# Patient Record
Sex: Female | Born: 1935 | Race: White | Hispanic: No | Smoking: Never smoker
Health system: Southern US, Community
[De-identification: ages and names within clinical notes are randomized; demographics above are authoritative.]

## PROBLEM LIST (undated history)

## (undated) DIAGNOSIS — I10 Essential (primary) hypertension: Secondary | ICD-10-CM

## (undated) DIAGNOSIS — E876 Hypokalemia: Secondary | ICD-10-CM

## (undated) DIAGNOSIS — M810 Age-related osteoporosis without current pathological fracture: Secondary | ICD-10-CM

## (undated) DIAGNOSIS — I639 Cerebral infarction, unspecified: Secondary | ICD-10-CM

## (undated) HISTORY — DX: Age-related osteoporosis without current pathological fracture: M81.0

## (undated) HISTORY — PX: HIP SURGERY: SHX245

## (undated) HISTORY — PX: BLADDER SUSPENSION: SHX72

---

## 2012-03-21 ENCOUNTER — Inpatient Hospital Stay: Payer: Self-pay | Admitting: General Practice

## 2012-03-21 LAB — URINALYSIS, COMPLETE
Bilirubin,UR: NEGATIVE
Blood: NEGATIVE
Glucose,UR: NEGATIVE mg/dL (ref 0–75)
Leukocyte Esterase: NEGATIVE
Nitrite: NEGATIVE
Protein: NEGATIVE
Squamous Epithelial: <1
WBC UR: NONE SEEN /HPF (ref 0–5)

## 2012-03-21 LAB — CBC
HGB: 12.8 g/dL (ref 12.0–16.0)
MCH: 30.1 pg (ref 26.0–34.0)
Platelet: 314 10*3/uL (ref 150–440)
RBC: 4.24 10*6/uL (ref 3.80–5.20)

## 2012-03-21 LAB — BASIC METABOLIC PANEL
Anion Gap: 9 (ref 7–16)
Chloride: 106 mmol/L (ref 98–107)
Co2: 25 mmol/L (ref 21–32)
Creatinine: 0.8 mg/dL (ref 0.60–1.30)
EGFR (African American): >60
EGFR (Non-African Amer.): >60
Glucose: 91 mg/dL (ref 65–99)
Osmolality: 282 (ref 275–301)
Potassium: 3.9 mmol/L (ref 3.5–5.1)
Sodium: 140 mmol/L (ref 136–145)

## 2012-03-22 LAB — PLATELET COUNT: Platelet: 231 10*3/uL (ref 150–440)

## 2012-03-22 LAB — BASIC METABOLIC PANEL
BUN: 14 mg/dL (ref 7–18)
Calcium, Total: 7.9 mg/dL — ABNORMAL LOW (ref 8.5–10.1)
Chloride: 110 mmol/L — ABNORMAL HIGH (ref 98–107)
Creatinine: 0.77 mg/dL (ref 0.60–1.30)
EGFR (African American): >60
EGFR (Non-African Amer.): >60
Glucose: 108 mg/dL — ABNORMAL HIGH (ref 65–99)
Osmolality: 284 (ref 275–301)

## 2012-03-23 LAB — BASIC METABOLIC PANEL
Anion Gap: 7 (ref 7–16)
Calcium, Total: 7.7 mg/dL — ABNORMAL LOW (ref 8.5–10.1)
Chloride: 110 mmol/L — ABNORMAL HIGH (ref 98–107)
Creatinine: 0.69 mg/dL (ref 0.60–1.30)
EGFR (African American): >60
Glucose: 107 mg/dL — ABNORMAL HIGH (ref 65–99)
Osmolality: 282 (ref 275–301)
Potassium: 3.9 mmol/L (ref 3.5–5.1)
Sodium: 141 mmol/L (ref 136–145)

## 2012-03-24 LAB — HEMOGLOBIN: HGB: 8.3 g/dL — ABNORMAL LOW (ref 12.0–16.0)

## 2012-03-25 LAB — CBC WITH DIFFERENTIAL/PLATELET
Basophil %: 0.2 %
HGB: 8.5 g/dL — ABNORMAL LOW (ref 12.0–16.0)
Lymphocyte #: 1.5 10*3/uL (ref 1.0–3.6)
Lymphocyte %: 13.7 %
MCHC: 33.6 g/dL (ref 32.0–36.0)
MCV: 92 fL (ref 80–100)
Monocyte #: 1.4 x10 3/mm — ABNORMAL HIGH (ref 0.2–0.9)
Monocyte %: 12.5 %
Platelet: 307 10*3/uL (ref 150–440)
RBC: 2.75 10*6/uL — ABNORMAL LOW (ref 3.80–5.20)
RDW: 11.9 % (ref 11.5–14.5)
WBC: 11 10*3/uL (ref 3.6–11.0)

## 2012-03-25 LAB — BASIC METABOLIC PANEL
Anion Gap: 6 — ABNORMAL LOW (ref 7–16)
Calcium, Total: 7.9 mg/dL — ABNORMAL LOW (ref 8.5–10.1)
Co2: 26 mmol/L (ref 21–32)
Creatinine: 0.6 mg/dL (ref 0.60–1.30)
EGFR (African American): >60
EGFR (Non-African Amer.): >60
Osmolality: 272 (ref 275–301)
Potassium: 3.7 mmol/L (ref 3.5–5.1)
Sodium: 136 mmol/L (ref 136–145)

## 2012-03-26 LAB — HEMOGLOBIN: HGB: 7.6 g/dL — ABNORMAL LOW (ref 12.0–16.0)

## 2012-03-27 ENCOUNTER — Encounter: Payer: Self-pay | Admitting: Internal Medicine

## 2012-03-27 LAB — HEMOGLOBIN: HGB: 9.3 g/dL — ABNORMAL LOW (ref 12.0–16.0)

## 2012-03-30 ENCOUNTER — Encounter: Payer: Self-pay | Admitting: Internal Medicine

## 2012-04-02 LAB — URINALYSIS, COMPLETE
Bilirubin,UR: NEGATIVE
Blood: NEGATIVE
Ph: 6 (ref 4.5–8.0)
Specific Gravity: 1.014 (ref 1.003–1.030)
Squamous Epithelial: 1

## 2012-04-30 ENCOUNTER — Encounter: Payer: Self-pay | Admitting: Internal Medicine

## 2012-11-12 ENCOUNTER — Encounter: Payer: Self-pay | Admitting: *Deleted

## 2012-11-13 ENCOUNTER — Encounter: Payer: Self-pay | Admitting: Podiatry

## 2012-11-13 ENCOUNTER — Ambulatory Visit (INDEPENDENT_AMBULATORY_CARE_PROVIDER_SITE_OTHER): Payer: Medicare Other | Admitting: Podiatry

## 2012-11-13 VITALS — BP 119/57 | HR 97 | Resp 12 | Ht 60.0 in

## 2012-11-13 DIAGNOSIS — B351 Tinea unguium: Secondary | ICD-10-CM

## 2012-11-13 DIAGNOSIS — M79609 Pain in unspecified limb: Secondary | ICD-10-CM | POA: Insufficient documentation

## 2012-11-13 NOTE — Progress Notes (Signed)
Cheryl Moreno presents today for chief complaint of painful toenails one through 5 bilateral.  Objective: Pulses are palpable bilateral no calf pain and bilateral. Nails are thick yellow dystrophic clinically mycotic 1 through 5 bilaterally. With pain on palpation and range of motion.  Assessment: Pain in limb secondary to onychomycosis 1 through 5 bilateral.  Plan: Debridement nails 1 through 5 bilaterally covered service secondary to pain.

## 2013-02-02 ENCOUNTER — Emergency Department: Payer: Self-pay | Admitting: Emergency Medicine

## 2013-02-02 LAB — CBC
HCT: 43.3 % (ref 35.0–47.0)
HGB: 14.6 g/dL (ref 12.0–16.0)
MCH: 30.2 pg (ref 26.0–34.0)
MCHC: 33.7 g/dL (ref 32.0–36.0)
MCV: 90 fL (ref 80–100)
PLATELETS: 271 10*3/uL (ref 150–440)
RBC: 4.82 10*6/uL (ref 3.80–5.20)
RDW: 13.3 % (ref 11.5–14.5)
WBC: 7.1 10*3/uL (ref 3.6–11.0)

## 2013-02-02 LAB — RAPID INFLUENZA A&B ANTIGENS

## 2013-02-02 LAB — COMPREHENSIVE METABOLIC PANEL
ALK PHOS: 110 U/L
ALT: 28 U/L (ref 12–78)
Albumin: 3.3 g/dL — ABNORMAL LOW (ref 3.4–5.0)
Anion Gap: 6 — ABNORMAL LOW (ref 7–16)
BILIRUBIN TOTAL: 0.3 mg/dL (ref 0.2–1.0)
BUN: 11 mg/dL (ref 7–18)
CALCIUM: 8.8 mg/dL (ref 8.5–10.1)
CO2: 30 mmol/L (ref 21–32)
CREATININE: 0.68 mg/dL (ref 0.60–1.30)
Chloride: 99 mmol/L (ref 98–107)
EGFR (African American): >60
Glucose: 133 mg/dL — ABNORMAL HIGH (ref 65–99)
OSMOLALITY: 271 (ref 275–301)
Potassium: 4.3 mmol/L (ref 3.5–5.1)
SGOT(AST): 38 U/L — ABNORMAL HIGH (ref 15–37)
Sodium: 135 mmol/L — ABNORMAL LOW (ref 136–145)
Total Protein: 7.5 g/dL (ref 6.4–8.2)

## 2013-02-02 LAB — LIPASE, BLOOD: Lipase: 143 U/L (ref 73–393)

## 2013-02-12 ENCOUNTER — Ambulatory Visit (INDEPENDENT_AMBULATORY_CARE_PROVIDER_SITE_OTHER): Payer: Medicare Other | Admitting: Podiatry

## 2013-02-12 ENCOUNTER — Encounter: Payer: Self-pay | Admitting: Podiatry

## 2013-02-12 VITALS — BP 84/52 | HR 53 | Resp 18

## 2013-02-12 DIAGNOSIS — B351 Tinea unguium: Secondary | ICD-10-CM

## 2013-02-12 DIAGNOSIS — M79609 Pain in unspecified limb: Secondary | ICD-10-CM

## 2013-02-12 NOTE — Progress Notes (Signed)
He presents today with an assistant for routine nail debridement. She states that her toenails are painful.  Objective: Vital signs are stable she is alert and oriented x3. Pulses are palpable bilateral. Nails are thick yellow dystrophic clinically mycotic and painful palpation as well as debridement. She's wheelchair-bound.  Assessment: Pain in limb secondary to onychomycosis 1 through 5 bilateral.  Plan: Debridement of nails 1 through 5 bilateral is cover service secondary to pain followup with her in 3 months.

## 2013-02-27 ENCOUNTER — Emergency Department: Payer: Self-pay | Admitting: Emergency Medicine

## 2013-02-27 LAB — URINALYSIS, COMPLETE
Bacteria: NONE SEEN
Bilirubin,UR: NEGATIVE
Cellular Cast: 6
GLUCOSE, UR: NEGATIVE mg/dL (ref 0–75)
Ketone: NEGATIVE
NITRITE: NEGATIVE
PH: 5 (ref 4.5–8.0)
RBC,UR: 2 /HPF (ref 0–5)
Specific Gravity: 1.014 (ref 1.003–1.030)
Transitional Epi: 4

## 2013-02-27 LAB — COMPREHENSIVE METABOLIC PANEL
ANION GAP: 6 — AB (ref 7–16)
Albumin: 3.1 g/dL — ABNORMAL LOW (ref 3.4–5.0)
Alkaline Phosphatase: 95 U/L
BILIRUBIN TOTAL: 0.3 mg/dL (ref 0.2–1.0)
BUN: 30 mg/dL — AB (ref 7–18)
CALCIUM: 8.7 mg/dL (ref 8.5–10.1)
CHLORIDE: 108 mmol/L — AB (ref 98–107)
CREATININE: 1.17 mg/dL (ref 0.60–1.30)
Co2: 26 mmol/L (ref 21–32)
EGFR (African American): 48 — ABNORMAL LOW
GFR CALC NON AF AMER: 42 — AB
GLUCOSE: 105 mg/dL — AB (ref 65–99)
Osmolality: 286 (ref 275–301)
Potassium: 3.8 mmol/L (ref 3.5–5.1)
SGOT(AST): 21 U/L (ref 15–37)
SGPT (ALT): 27 U/L (ref 12–78)
SODIUM: 140 mmol/L (ref 136–145)
Total Protein: 7.6 g/dL (ref 6.4–8.2)

## 2013-02-27 LAB — CBC
HCT: 41.3 % (ref 35.0–47.0)
HGB: 13.6 g/dL (ref 12.0–16.0)
MCH: 30 pg (ref 26.0–34.0)
MCHC: 33 g/dL (ref 32.0–36.0)
MCV: 91 fL (ref 80–100)
Platelet: 366 10*3/uL (ref 150–440)
RBC: 4.55 10*6/uL (ref 3.80–5.20)
RDW: 13.7 % (ref 11.5–14.5)
WBC: 9.1 10*3/uL (ref 3.6–11.0)

## 2013-02-27 LAB — SYNOVIAL CELL COUNT + DIFF, W/ CRYSTALS
Basophil: 0 %
Crystals, Joint Fluid: NONE SEEN
Eosinophil: 2 %
LYMPHS PCT: 77 %
Neutrophils: 18 %
Nucleated Cell Count: 15188 /mm3
OTHER CELLS BF: 0 %
OTHER MONONUCLEAR CELLS: 3 %

## 2013-02-27 LAB — LIPASE, BLOOD: Lipase: 110 U/L (ref 73–393)

## 2013-02-27 LAB — TROPONIN I: Troponin-I: <0.02 ng/mL

## 2013-05-14 ENCOUNTER — Encounter: Payer: Self-pay | Admitting: Podiatry

## 2013-05-14 ENCOUNTER — Ambulatory Visit (INDEPENDENT_AMBULATORY_CARE_PROVIDER_SITE_OTHER): Payer: Medicare Other | Admitting: Podiatry

## 2013-05-14 VITALS — BP 112/68 | HR 60 | Resp 14 | Wt 125.0 lb

## 2013-05-14 DIAGNOSIS — B351 Tinea unguium: Secondary | ICD-10-CM

## 2013-05-14 DIAGNOSIS — M79609 Pain in unspecified limb: Secondary | ICD-10-CM

## 2013-05-14 NOTE — Progress Notes (Signed)
She presents today with a chief complaint of the foot elongated toenails one through 5 bilateral.  Objective: Pulses are palpable bilateral. Nails are thick yellow dystrophic with mycotic painful palpation.  Assessment: Pain in limb secondary to onychomycosis 1 through 5 bilateral.  Plan: Debridement of nails 1 through 5 bilateral covered service secondary to pain.

## 2013-06-11 ENCOUNTER — Ambulatory Visit: Payer: Self-pay | Admitting: Neurology

## 2013-06-11 ENCOUNTER — Inpatient Hospital Stay: Payer: Self-pay | Admitting: Internal Medicine

## 2013-06-11 LAB — CBC WITH DIFFERENTIAL/PLATELET
BASOS ABS: 0.1 10*3/uL (ref 0.0–0.1)
BASOS PCT: 0.2 %
Eosinophil #: 0 10*3/uL (ref 0.0–0.7)
Eosinophil %: 0 %
HCT: 45.8 % (ref 35.0–47.0)
HGB: 15.1 g/dL (ref 12.0–16.0)
Lymphocyte #: 1.5 10*3/uL (ref 1.0–3.6)
Lymphocyte %: 6.9 %
MCH: 29.7 pg (ref 26.0–34.0)
MCHC: 32.9 g/dL (ref 32.0–36.0)
MCV: 91 fL (ref 80–100)
MONOS PCT: 6 %
Monocyte #: 1.3 x10 3/mm — ABNORMAL HIGH (ref 0.2–0.9)
NEUTROS PCT: 86.9 %
Neutrophil #: 18.7 10*3/uL — ABNORMAL HIGH (ref 1.4–6.5)
Platelet: 312 10*3/uL (ref 150–440)
RBC: 5.06 10*6/uL (ref 3.80–5.20)
RDW: 13.5 % (ref 11.5–14.5)
WBC: 21.5 10*3/uL — AB (ref 3.6–11.0)

## 2013-06-11 LAB — COMPREHENSIVE METABOLIC PANEL
ANION GAP: 9 (ref 7–16)
AST: 22 U/L (ref 15–37)
Albumin: 3.6 g/dL (ref 3.4–5.0)
Alkaline Phosphatase: 104 U/L
BILIRUBIN TOTAL: 0.5 mg/dL (ref 0.2–1.0)
BUN: 13 mg/dL (ref 7–18)
CO2: 25 mmol/L (ref 21–32)
Calcium, Total: 9.1 mg/dL (ref 8.5–10.1)
Chloride: 102 mmol/L (ref 98–107)
Creatinine: 0.7 mg/dL (ref 0.60–1.30)
EGFR (African American): >60
EGFR (Non-African Amer.): >60
Glucose: 130 mg/dL — ABNORMAL HIGH (ref 65–99)
Osmolality: 274 (ref 275–301)
Potassium: 3.9 mmol/L (ref 3.5–5.1)
SGPT (ALT): 21 U/L (ref 12–78)
Sodium: 136 mmol/L (ref 136–145)
Total Protein: 8.3 g/dL — ABNORMAL HIGH (ref 6.4–8.2)

## 2013-06-11 LAB — URINALYSIS, COMPLETE
Bacteria: NONE SEEN
Bilirubin,UR: NEGATIVE
Blood: NEGATIVE
GLUCOSE, UR: NEGATIVE mg/dL (ref 0–75)
Leukocyte Esterase: NEGATIVE
Nitrite: NEGATIVE
PH: 7 (ref 4.5–8.0)
Specific Gravity: 1.013 (ref 1.003–1.030)
Squamous Epithelial: NONE SEEN

## 2013-06-11 LAB — TROPONIN I: Troponin-I: <0.02 ng/mL

## 2013-06-11 LAB — PROTIME-INR
INR: 1
PROTHROMBIN TIME: 13.4 s (ref 11.5–14.7)

## 2013-06-11 LAB — APTT: Activated PTT: 30.9 secs (ref 23.6–35.9)

## 2013-06-12 LAB — BASIC METABOLIC PANEL
Anion Gap: 7 (ref 7–16)
BUN: 18 mg/dL (ref 7–18)
CHLORIDE: 102 mmol/L (ref 98–107)
Calcium, Total: 8.8 mg/dL (ref 8.5–10.1)
Co2: 27 mmol/L (ref 21–32)
Creatinine: 0.95 mg/dL (ref 0.60–1.30)
GFR CALC NON AF AMER: 53 — AB
Glucose: 123 mg/dL — ABNORMAL HIGH (ref 65–99)
Osmolality: 275 (ref 275–301)
Potassium: 4.1 mmol/L (ref 3.5–5.1)
Sodium: 136 mmol/L (ref 136–145)

## 2013-06-12 LAB — CBC WITH DIFFERENTIAL/PLATELET
BASOS ABS: 0 10*3/uL (ref 0.0–0.1)
Basophil %: 0.2 %
Eosinophil #: 0 10*3/uL (ref 0.0–0.7)
Eosinophil %: 0.1 %
HCT: 42 % (ref 35.0–47.0)
HGB: 13.6 g/dL (ref 12.0–16.0)
LYMPHS PCT: 13.9 %
Lymphocyte #: 2.6 10*3/uL (ref 1.0–3.6)
MCH: 29.2 pg (ref 26.0–34.0)
MCHC: 32.3 g/dL (ref 32.0–36.0)
MCV: 90 fL (ref 80–100)
Monocyte #: 1 x10 3/mm — ABNORMAL HIGH (ref 0.2–0.9)
Monocyte %: 5.1 %
NEUTROS PCT: 80.7 %
Neutrophil #: 15.3 10*3/uL — ABNORMAL HIGH (ref 1.4–6.5)
Platelet: 306 10*3/uL (ref 150–440)
RBC: 4.65 10*6/uL (ref 3.80–5.20)
RDW: 13.4 % (ref 11.5–14.5)
WBC: 19 10*3/uL — ABNORMAL HIGH (ref 3.6–11.0)

## 2013-06-12 LAB — LIPID PANEL
Cholesterol: 120 mg/dL (ref 0–200)
HDL Cholesterol: 64 mg/dL — ABNORMAL HIGH (ref 40–60)
Ldl Cholesterol, Calc: 43 mg/dL (ref 0–100)
TRIGLYCERIDES: 66 mg/dL (ref 0–200)
VLDL Cholesterol, Calc: 13 mg/dL (ref 5–40)

## 2013-08-13 ENCOUNTER — Ambulatory Visit: Payer: Medicare Other | Admitting: Podiatry

## 2013-08-29 ENCOUNTER — Ambulatory Visit (INDEPENDENT_AMBULATORY_CARE_PROVIDER_SITE_OTHER): Payer: Medicare Other | Admitting: Podiatry

## 2013-08-29 DIAGNOSIS — B351 Tinea unguium: Secondary | ICD-10-CM

## 2013-08-29 DIAGNOSIS — M79676 Pain in unspecified toe(s): Secondary | ICD-10-CM

## 2013-08-29 DIAGNOSIS — M79609 Pain in unspecified limb: Secondary | ICD-10-CM

## 2013-08-30 NOTE — Progress Notes (Signed)
She presents today with a chief complaint of painful elongated toenails. ° °Objective: Nails are thick yellow dystrophic onychomycotic and painful palpation. Pulses are palpable bilateral. ° °Assessment: Pain in limb secondary to onychomycosis 1 through 5 bilateral. ° °Plan: Debridement of nails 1 through 5 bilateral covered service secondary to pain. °

## 2013-12-03 ENCOUNTER — Ambulatory Visit: Payer: Medicare Other | Admitting: Podiatry

## 2013-12-10 ENCOUNTER — Ambulatory Visit (INDEPENDENT_AMBULATORY_CARE_PROVIDER_SITE_OTHER): Payer: Medicare Other | Admitting: Podiatry

## 2013-12-10 DIAGNOSIS — M79676 Pain in unspecified toe(s): Secondary | ICD-10-CM

## 2013-12-10 DIAGNOSIS — B351 Tinea unguium: Secondary | ICD-10-CM

## 2013-12-10 NOTE — Progress Notes (Signed)
She presents today with a chief complaint of painful elongated toenails.  Objective: Nails are thick yellow dystrophic onychomycotic and painful palpation. Pulses are palpable bilateral.  Assessment: Pain in limb secondary to onychomycosis 1 through 5 bilateral.  Plan: Debridement of nails 1 through 5 bilateral covered service secondary to pain.

## 2014-03-11 ENCOUNTER — Ambulatory Visit: Payer: Medicare Other | Admitting: Podiatry

## 2014-05-22 NOTE — Consult Note (Signed)
PATIENT NAME:  Cheryl Moreno, Jeneen MR#:  [id number] DATE OF BIRTH:  Aug 20, 1922  DATE OF CONSULTATION:  03/20/2012  REFERRING PHYSICIAN:  Dr. Ernest PineHooten, orthopedics.  CONSULTING PHYSICIAN:  Thad Rangeripudeep Evart Mcdonnell, MD.  REASON FOR CONSULTATION: Medical clearance and comanagement for right hip fracture.   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 Presented to the ED with right hip fracture. History was obtained from the patient and her daughter present in the room. The patient usually ambulates with a walker. She does need assistance with her ADLs with her residual right-sided lower extremity weakness. She apparently went to the bathroom assisted and subsequently got up on her own to return back and fell. She denied any dizziness, lightheadedness or any syncopal episode. She has no history of any coronary artery disease or CHF, diabetes mellitus. She denies any chest pain or any shortness of breath. Other than the pain in her right hip, the patient denied any specific complaints.   REVIEW OF SYSTEMS:  CONSTITUTIONAL: She denies any fever or any fatigue. She has residual right-sided lower extremity weakness from the previous stroke in 2013 but is able to ambulate with a walker.  HEENT: Denies any tinnitus or hearing loss or any eye pain or ear pain.  RESPIRATORY: The patient denies any coughing, wheezing, hemoptysis or any dyspnea.  CARDIOVASCULAR: She denies any chest pain, any palpitations, orthopnea or PND.  GASTROINTESTINAL: The patient denies any nausea, vomiting, diarrhea, abdominal pain, hematochezia or melena.  GENITOURINARY: The patient denies any dysuria or hematuria.  HEMATOLOGY: The patient denies any easy bruising or bleeding.  NEUROLOGIC: She has a prior history of left basal  ganglia hemorrhagic CVA in 2013 and had undergone rehab. She still has right-sided residual weakness in the lower extremity.   PAST MEDICAL HISTORY:  1. Prior history of syncope.  2. Hypertension.  3. Left basal ganglia hemorrhagic CVA with residual right-sided weakness in February 2013.   ALLERGIES: SULFA DRUGS.   MEDICATIONS PRIOR TO ADMISSION: Vitamin D3 1000 international units 1 cap once a day, Tylenol 325 mg 2 tabs every 4 hours as needed, TUMS 500 mg oral tablet once a day, Norvasc 10 mg daily, multivitamin 1 tablet daily, enalapril 20 mg daily.   SOCIAL HISTORY: The patient denies any alcohol, smoking or any drug use. She is currently a resident of [facility] and ambulates with a walker.   PHYSICAL EXAMINATION:  VITAL SIGNS: Temp 98, pulse 72, respirations 20, blood pressure 142/60. O2 sat is 94% on room air.  GENERAL: The patient is alert and awake, not in any acute distress, pleasant and cooperative.  HEENT: Anicteric sclerae. Pink conjunctivae. Pupils reactive to light and accommodation. EOMI.  NECK: Supple. No lymphadenopathy. No JVD.  CARDIOVASCULAR SYSTEM: S1, S2 clear.  CHEST: Clear to auscultation bilaterally anteriorly.  ABDOMEN: Soft, nontender, nondistended. Normal bowel sounds.  EXTREMITIES: No cyanosis, clubbing or edema noted.  NEUROLOGIC: Strength not assessed in the lower extremities secondary to the right hip fracture.   LABS AND DIAGNOSTIC DATA: Sodium 140, potassium 3.9, BUN 20, creatinine 0.8. WBC is 8.3, hemoglobin 12.8, platelets 314.   IMPRESSION, PLAN AND RECOMMENDATIONS: The patient is a pleasant 81 year old female with a history of hypertension, history of prior cerebrovascular accident in 2013, presenting with a right hip fracture secondary to mechanical fall.  1. Right hip fracture: The patient does not have a history of coronary artery disease or congestive heart failure or any active cardiac symptoms. She did not have any  syncopal episode today. The patient has a right hip fracture secondary to mechanical fall. Given her advanced age of 81 years and recent history of cerebrovascular accident and hypertension, she is moderate risk for right hip surgery; however, she is medically clear as it will improve the quality of life and she is otherwise functional with her activities of daily living. Her vital signs are stable currently. I will continue all of her antihypertensives.  2. Hypertension: Currently stable. I will restart Norvasc and enalapril.  3. Previous history of cerebrovascular accident with right-sided residual deficit: The patient is noted to be not on aspirin or Plavix secondary to hemorrhagic cerebrovascular accident. The patient will need rehabilitation and most likely high level of care this time. She most likely will needed a skilled nursing facility at the time of discharge for rehabilitation.  4. Deep venous thrombosis prophylaxis per orthopedics.   CODE STATUS: Per her chart, she is DNR status.   TIME SPENT: One hour.     ____________________________ Thad Ranger, MD rr:gb D: 03/21/2012 01:09:26 ET T: 03/21/2012 03:01:20 ET JOB#: [id number]  cc: Thad Ranger, MD, <Dictator> Illene Labrador. Angie Fava., MD Andres Labrum Lynnmarie Lovett MD ELECTRONICALLY SIGNED 04/13/2012 20:13

## 2014-05-22 NOTE — Op Note (Signed)
PATIENT NAME:  Alanson PulsLLEN, Rosalva MR#:  [id number] DATE OF BIRTH:  1922/06/26  DATE OF PROCEDURE:  03/24/2012  PREOPERATIVE DIAGNOSIS:  Right periprosthetic femur fracture.   POSTOPERATIVE DIAGNOSIS:  Right periprosthetic femur fracture.   PROCEDURE:  Open reduction internal fixation right periprosthetic femur fracture.   ANESTHESIA:  Spinal.   SURGEON:  Leitha SchullerMichael J. Eyana Stolze, MD  DESCRIPTION OF PROCEDURE:  The patient was brought to the operating room and after adequate spinal anesthesia was obtained, she was placed on the fracture table and the hip prepped and draped in the usual sterile fashion with the barrier drape method after C-arm was shown to well visualize the fracture. After patient identification and timeout procedures were completed, a proximal incision was made at the level of the greater trochanter distal flare extending distally approximately 5 cm. The skin and subcutaneous tissue were incised, the IT band split and the vastus lateralis elevated off from posterior to anterior. A 12-hole LCDCP locking plate was then chosen and slid down along the periosteum laterally. A cerclage wire was placed proximally at the level of the lesser trochanter through a locking eyelet in the most proximal hole. This was provisionally tightened and there was near anatomic alignment of the fracture. Distally, a distal incision was made over the distal end of the plate in a similar fashion and a nonlocking screw was inserted after drilling, measuring and placing the cortical screw. This brought the plate down to the distal femur and subsequently a locking screw and an additional cortical screw were placed distally to affix the plate to the bone. Going back proximally 2 additional wires were placed around the proximal end of the plate tightening them sequentially using the Synthes set and when these were well tightened and there appeared to be essentially anatomic alignment, the clamps were clamped and the wires cut short.  The wounds distally and proximally were thoroughly irrigated. The deep fascia repaired using a running 0 Vicryl, 2-0 Vicryl subcutaneously and skin staples, Xeroform, 4 x 4's, and ABD applied on tape.   ESTIMATED BLOOD LOSS:  200 mL.   COMPLICATIONS:  None.   SPECIMEN:  None.   CONDITION:  To recovery room stable.    ____________________________ Leitha SchullerMichael J. Latravia Southgate, MD mjm:si D: 03/24/2012 20:01:24 ET T: 03/24/2012 23:01:04 ET JOB#: [id number]  cc: Leitha SchullerMichael J. Trinaty Bundrick, MD, <Dictator> Leitha SchullerMICHAEL J Yonna Alwin MD ELECTRONICALLY SIGNED 03/25/2012 6:15

## 2014-05-22 NOTE — H&P (Signed)
Subjective/Chief Complaint Right hip pain   History of Present Illness 81 year old female resident of [facility] who apparently fell while getting up from the toilet, landing on her right hip and side. She had the immediate onset of right hip pain and was unable to stand or bear weight on the right leg due to the pain. She denied any other injury. She denied any loss of consciousness. Prior to the fall she was ambulating with a walker.   Past Med/Surgical Hx:  Hypertension:   left basal ganglia hemorrage RR side weakness:   syncope:   Hypokalemia:   appendectomy:   ALLERGIES:  Sulfa drugs: Unknown  HOME MEDICATIONS: Medication Instructions Status  Norvasc 10 mg oral tablet 1 tab(s) orally once a day Active  enalapril 20 mg oral tablet 1 tab(s) orally once a day Active  multivitamin 1   once a day Active  Tums 500 500 mg oral tablet, chewable 2  orally once a day (at bedtime) Active  Vitamin D3 1000 intl units oral capsule 1 cap(s) orally once a day Active  Tylenol 325 mg oral tablet 2 tab(s) orally every 4 hours, As Needed- for Pain  Active   Family and Social History:  Family History Non-Contributory   Social History negative tobacco, negative ETOH, negative Illicit drugs   Place of Living [place]   Review of Systems:  Fever/Chills No   Cough No   Sputum No   Abdominal Pain No   Diarrhea No   Constipation No   Nausea/Vomiting No   SOB/DOE No   Chest Pain No   Dysuria No   Physical Exam:  GEN well developed, well nourished, no acute distress   HEENT pink conjunctivae, PERRL, Oropharynx clear   NECK supple   RESP normal resp effort  clear BS  no use of accessory muscles   CARD regular rate  no murmur  No LE edema  no JVD   ABD denies tenderness  denies Flank Tenderness  soft  normal BS   EXTR Right lower extremity is shortened and externally rotated. Pain is elicited with attempts at range of motion of the hip. No gross tenderness to palpation  of the knee. No gross knee effusion.   SKIN normal to palpation   NEURO negative tremor, motor/sensory function intact   PSYCH alert, A+O to time, place, person   Lab Results: Routine Chem:  19-Feb-14 23:50   Glucose, Serum 91  BUN  20  Creatinine (comp) 0.80  Sodium, Serum 140  Potassium, Serum 3.9  Chloride, Serum 106  CO2, Serum 25  Calcium (Total), Serum 8.9  Anion Gap 9  Osmolality (calc) 282  eGFR (African American) >60  eGFR (Non-African American) >60 (eGFR values <22m/min/1.73 m2 may be an indication of chronic kidney disease (CKD). Calculated eGFR is useful in patients with stable renal function. The eGFR calculation will not be reliable in acutely ill patients when serum creatinine is changing rapidly. It is not useful in  patients on dialysis. The eGFR calculation may not be applicable to patients at the low and high extremes of body sizes, pregnant women, and vegetarians.)  Routine UA:  20-Feb-14 23:35   Color (UA) Straw  Clarity (UA) Clear  Glucose (UA) Negative  Bilirubin (UA) Negative  Ketones (UA) Negative  Specific Gravity (UA) 1.008  Blood (UA) Negative  pH (UA) 7.0  Protein (UA) Negative  Nitrite (UA) Negative  Leukocyte Esterase (UA) Negative (Result(s) reported on 21 Mar 2012 at 01:29AM.)  RBC (UA) 1 /HPF  WBC (UA) NONE SEEN  Bacteria (UA) NONE SEEN  Epithelial Cells (UA) <1 /HPF (Result(s) reported on 21 Mar 2012 at 01:29AM.)  Routine Hem:  19-Feb-14 23:50   WBC (CBC) 8.3  RBC (CBC) 4.24  Hemoglobin (CBC) 12.8  Hematocrit (CBC) 39.6  Platelet Count (CBC) 314 (Result(s) reported on 21 Mar 2012 at 12:10AM.)  MCV 93  MCH 30.1  MCHC 32.3  RDW 12.6    Assessment/Admission Diagnosis Right femoral neck fracture, displaced   Plan Recommend right hip hemiarthroplasty.  The risks and benefits of surgical intervention were discussed in detail with the patient. The patient expressed understanding of the risks and benefits and agreed with  plans for surgery.   The potential risks and benefits of blood transfusion have been discussed with the patient.The patient expressed understanding of the risks and benefits and has signed the appropriate consent for blood transfusion.   Surgical site signed as per "right site surgery" protocol.   Electronic Signatures: Dereck Leep (MD)  (Signed 20-Feb-14 02:13)  Authored: CHIEF COMPLAINT and HISTORY, PAST MEDICAL/SURGIAL HISTORY, ALLERGIES, HOME MEDICATIONS, FAMILY AND SOCIAL HISTORY, REVIEW OF SYSTEMS, PHYSICAL EXAM, LABS, ASSESSMENT AND PLAN   Last Updated: 20-Feb-14 02:13 by Dereck Leep (MD)

## 2014-05-22 NOTE — Discharge Summary (Signed)
PATIENT NAME:  Cheryl Moreno, Cheryl Moreno MR#:  [id number] DATE OF BIRTH:  Jan 27, 1923  Dictated for Kennedy BuckerMichael Menz, MD  DATE OF ADMISSION:  03/21/2012 DATE OF DISCHARGE:  03/27/2012  ADMITTING DIAGNOSIS: Right hip hemiarthroplasty.   DISCHARGE DIAGNOSES:  1. Right hip hemiarthroplasty.  2. Status post right intertrochanteric periprosthetic fracture, with open reduction, internal fixation.   OPERATION: Initially on 03/23/2012 the patient was brought to the operating room for a right hip hemiarthroplasty. The patient had spinal anesthesia.   SURGEON: Dr. Kennedy BuckerMichael Menz.   ESTIMATED BLOOD LOSS: 200 mL.   COMPLICATIONS: None.   CONDITION: Stable. Then on 03/24/2012 the patient was brought back to the operating room because of periprosthetic femur fracture; had an open reduction and internal fixation.   ANESTHESIA: Spinal.   ESTIMATED BLOOD LOSS: Another 200 mL.   COMPLICATIONS: None.   The patient was brought to the recovery room and brought back to the orthopedic floor.   HISTORY AND PHYSICAL:   HISTORY: The patient is an 81 year old female who presented for right hip pain. [facility]. The patient was getting up to the toilet when she fell on her right hip. The patient had and immediate onset of pain and could not ambulate. X-rays revealed a right femoral neck fracture.   PHYSICAL EXAMINATION: GENERAL: Well-developed female in no acute distress.  CARDIAC: Regular rate and rhythm.  LUNGS: Clear to auscultation.  MUSCULOSKELETAL: In regard to the right lower extremity, the patient has shortening and external rotation with pain with any type of motion. The patient has limited motion of the knee and ankle. The patient is neurovascularly intact and is in Buck's traction. The patient was stabilized and brought to the orthopedic floor for surgery the following day for a right femoral neck fracture.   HOSPITAL COURSE: After initial admission on 03/20/2012, the patient was brought to the  orthopedic floor for surgery the following day. The patient did well on 03/21/2012, then on postoperative day 1 the patient was complaining of significant pain involving the thigh, with an external rotation. X-rays revealed a periprosthetic intertrochanteric femur fracture.   Then on 03/24/2012 she was brought back to the operating room for an open reduction and internal fixation with plate and wires. The patient worked with physical therapy initially bed-to-chair, and then after her second surgery was still unable to ambulate, having difficulty.   The patient on postoperative day 1 from the hemiarthroplasty had a hemoglobin of 10.8. On 03/23/2012 had 8.9, then after her second surgery hemoglobin was down to 8.3, and then on 03/26/2012 had 7.6 and received 1 unit of transfused blood, where her hemoglobin increased to 9.3. The patient was stable. The patient worked with physical therapy and was ready to go to rehab, then skilled nursing on 03/27/2012.   CONDITION AT DISCHARGE: Stable.   DISPOSITION: The patient was sent to rehab.   DISCHARGE INSTRUCTIONS: The patient will follow up [facility] in two weeks. The patient will do weight-bear as tolerated on the affected leg. The patient will use 1 to 2 pillows. The patient will use knee-high TED hose on both legs. The patient will elevate her heels off the bed and do cough and deep-breathing. The patient will keep her dressing clean and have it changed on a p.r.n. basis. The patient will have the rehab center call if there is any bright red bleeding or fever greater than 101.5, or any calf or bowel or bladder difficulty.    DISCHARGE MEDICATIONS: Tylenol 325  mg, 2 tablets q. 4. hours, oxycodone one tablet q. 4 hours as  needed for pain that is severe, tramadol one tablet q. 4 to 6 hours, enalapril 1 tablet p.o. daily, magnesium hydroxide 30 mL twice a day as needed for constipation, Milk of Magnesia 30 mL q. 6 hours as needed for  heartburn Lovenox 30 mg subcutaneously daily, amlodipine 10 mg daily, ferrous sulfate 325 mg 1 tablet p.o. twice daily, bisacodyl 10 mg rectally 1 suppository as needed for constipation, docusate calcium 240 mg p.o. daily at bedtime, Senokot-S 1 tablet p.o. twice daily, multivitamin 1 tablet p.o. daily, and cholecalciferol tablet 1000 units daily.     ____________________________ Shela Commons. Dedra Skeens, [place] jtm:dm D: 03/27/2012 07:56:00 ET T: 03/27/2012 08:08:16 ET JOB#: [id number]  cc: J. Dedra Skeens, [place], <Dictator> J Legna Mausolf [facility] PA ELECTRONICALLY SIGNED 04/10/2012 13:23

## 2014-05-22 NOTE — Op Note (Signed)
PATIENT NAME:  Cheryl Moreno, Radha MR#:  [id number] DATE OF BIRTH:  April 21, 1922  DATE OF PROCEDURE:  03/21/2012  PREOPERATIVE DIAGNOSIS: Right femoral neck fracture, displaced.   POSTOPERATIVE DIAGNOSIS: Right femoral neck fracture, displaced.   PROCEDURE: Right hip hemiarthroplasty.   ANESTHESIA: Spinal.   SURGEON: Leitha SchullerMichael J. Lum Stillinger, MD   DESCRIPTION OF PROCEDURE: The patient was brought to the operating room, and after adequate anesthesia was obtained, the patient was placed on the operative table with the left leg in a well leg holder, right leg in the Medacta attachment. Preop x-ray taken for leg length for subsequent comparison to implants. After prepping and draping in the usual sterile fashion, appropriate patient identification and timeout procedures were carried out. An anterior approach was made to the hip, centered over the greater trochanter in line with the tensor fascia lata muscle. Incision was carried down through the skin and subcutaneous tissue, with hemostasis being achieved with electrocautery. The tensor fascia lata muscle was entered and retracted laterally, the rectus femoris muscle identified anteriorly and retracted medially, and the anterior capsule exposed and anterior capsulotomy carried out. The femoral neck cut was made, and the head was removed, with significant osteoporosis present within the femoral head, but without articular wear. The head sized to approximately 43 mm, and a 43 mm trial appeared to fit the acetabulum well. Next, the leg was dropped into extension and external rotation with adduction, with release of part of the posterior capsule. Sequential broaching was carried out to a #3 and a tight fit obtained. The cement restrictor was placed and the canal irrigated and dried. A #2 stem was inserted with cement in the canal, with excess cement removed. After the cement had set, trials were placed, and a medium head chosen to go with the 43 mm bipolar head, with the head  applied, followed by the bipolar head on top of the initial 22 mm head. The hip was reduced, and leg lengths appeared equal. C-arm views intraoperatively appeared to show good sizing of the femoral head component with acceptable cement mantle. The hip was thoroughly irrigated. The anterior capsule was repaired using Ethibond suture, heavy Quill for the deep fascia. A subcutaneous drain was placed, with 25% Sensorcaine with epinephrine 30 mL infiltrated into the muscle. Subcutaneous 2-0 Quill suture was then used, followed by skin staples. Xeroform, 4 x 4's, ABD and tape applied.   ESTIMATED BLOOD LOSS: 200 mL.   COMPLICATIONS: None.   SPECIMEN: Femoral head.   CONDITION: To recovery room, stable.  ____________________________ Leitha SchullerMichael J. Nandi Tonnesen, MD mjm:OSi D: 03/22/2012 00:28:00 ET T: 03/22/2012 11:19:03 ET JOB#: [id number]  cc: Leitha SchullerMichael J. Nekayla Heider, MD, <Dictator> Leitha SchullerMICHAEL J Seve Monette MD ELECTRONICALLY SIGNED 03/22/2012 16:35

## 2014-05-23 NOTE — Consult Note (Signed)
PATIENT NAME:  Cheryl Moreno, FRENZ MR#:  [id number] DATE OF BIRTH:  May 17, 1922  DATE OF CONSULTATION:  06/11/2013  REFERRING PHYSICIAN:   CONSULTING PHYSICIAN:  Pauletta Browns, MD  REASON FOR CONSULTATION:  Stroke.  HISTORY OF PRESENT ILLNESS> : An 81 year old female with past medical history of a stroke which appears to be a left basal ganglia hemorrhage as per records about 2 years ago, at that time, the patient had right upper and right lower extremity weakness which has completely resolved. The patient had no residual deficits. The patient is status post hip surgery and hip replacement about a year ago with residual right lower extremity weakness, unable to move it. At baseline, the patient lives in a nursing home facility and does not ambulate but is very social with peers at the facility. Today, noted around 2:00 to 3:00 p.m. at nursing facility, the patient was aphasic and had a right facial droop. No known time of last being seen normal. Based on the patient's records on medications, the patient was supposed to be taking aspirin 81 mg daily. Other her information obtained from the daughter currently.   PHYSICAL EXAMINATION: GENERAL:  The patient appears to be aphasic and has right facial droop, so total NIH stroke scale is about a 3. No significant motor deficits were noted other than chronic right lower extremity weakness. The patient responds to visual threats bilaterally.   MEDICATION: Would include enalapril, Docusate, iron, calcium, aspirin 81 mg, venlafaxine, Tums, senna, odansetron.   SOCIAL HISTORY: The patient lives in a nursing home facility. Does not smoke, does not drink, no drug use.   PAST MEDICAL HISTORY: Stroke, hypertension, history of syncope, history of hypokalemia and history of appendectomy.   PAST SURGICAL HISTORY: Right hip replacement, right hip surgery about a year ago.   LABORATORY, DIAGNOSTIC AND RADIOLOGICAL DATA:  Laboratory workup has been reviewed. The patient is  status post CAT scan of the head, no acute intracranial hemorrhage or ischemia found. The patient has chronic small vessel disease.   PHYSICAL EXAMINATION: VITAL SIGNS:  On the flow sheet, the patient's blood pressure is 187/85, heart rate 81, saturating well on room air.  NEUROLOGIC: The patient is globally aphasic, only responds yes. Responds to visual threats bilaterally. Pupils 3 mm to 2 mm, reactive bilaterally. The patient has evidence of right facial droop, unable to see if tongue is midline since the patient does not cooperate.  On motor strength. Examination, when extending her arms the patient appears to hold them symmetrically and no drift noted. Sensation: The patient withdraws from painful stimuli symmetrically. Reflexes are hyperreflexive right lower extremity as chronic hip surgery, otherwise symmetrical. Coordination could not be assessed. Gait could not be assessed.   IMPRESSION: An 81 year old female with history of stroke about 2 years ago without any residual symptoms, status post hip replacement surgery about a year ago.  Since that time, the patient has been wheelchair-bound and has contractures and unable to move the right lower extremity, presenting with acute onset of right facial droop and global aphasia. On my examination, NIH stroke scale 3.   PLAN: Since we do not know the last time seen normal, unfortunately, she is not an IV TPA candidate. Due to the low NIH stroke scale score and her age, I do not think there is a need to transfer the patient to our IA or mechanical thrombolysis. Admit the patient to the hospital. Blood pressure goal is less than 220/110 with permissive hypertension initially. Increase her aspirin to  325. Physical therapy, occupational therapy, speech MRI, 2-D echo, lipid panel, hemoglobin A1c.    Thank you, it was a pleasure seeing this patient. This case was discussed with Emergency Department staff.   ____________________________ Pauletta BrownsYuriy Navdeep Fessenden,  MD yz:cs D: 06/11/2013 18:40:05 ET T: 06/11/2013 19:25:55 ET JOB#: [id number]  cc: Pauletta BrownsYuriy Srah Ake, MD, <Dictator> Pauletta BrownsYURIY Meia Emley MD ELECTRONICALLY SIGNED 06/30/2013 18:41

## 2014-05-23 NOTE — Discharge Summary (Signed)
PATIENT NAME:  Cheryl Moreno, Cheryl Moreno MR#:  [id number] DATE OF BIRTH:  02/08/1922  DATE OF ADMISSION:  06/11/2013 DATE OF DISCHARGE:  06/13/2013   ADMITTING DIAGNOSIS: Expressive aphasia.   DISCHARGE DIAGNOSES:  1. Expressive aphasia, possibly related to a transient ischemic attack, symptoms now back to baseline. MRI shows no evidence of cerebrovascular accident.  2. Hypertension.  3. Chronic anxiety and agitation with possible dementia.  4. Left basal ganglia cerebrovascular accident with residual right-sided weakness.  5. Leukocytosis due to possible bronchitis.   CONSULTANTS: Neurology in the ED.   PERTINENT LABORATORIES AND EVALUATIONS: Admitting glucose 130, BUN 13, creatinine 0.7, sodium 136, potassium 3.9, chloride 102, CO2 was 25, calcium 9.1. LFTs: Total protein 8.3, albumin 3.6, AST 22, ALT 21. Troponin less than 0.02. WBC 21.5, hemoglobin 15.1, platelet count is 312. INR 1. Urinalysis with 1 WBC. CT scan of the head without contrast showed no evidence of acute intracranial abnormality. MRI of the brain showed no evidence of acute intracranial abnormality, moderate chronic vessel ischemic changes. Carotid Dopplers showed atherosclerotic plaque in the bilateral carotid arteries. Echo: The patient refused. Cholesterol panel: Total cholesterol 120, triglycerides 66, HDL 64, LDL was 43.   HOSPITAL COURSE: Please refer to H and P done by the admitting physician. The patient is an 81 year old white female resident of a skilled nursing facility, who has history of intermittent agitation, history of previous CVA, who was sent for difficulty with her speech. She had a hard time expressing herself. The patient came with these symptoms, was thought to have initially a CVA. Underwent evaluation with MRI of the brain, which was negative. The patient also had carotid Dopplers. She was continued on aspirin. She was seen in consultation by neurology, who agreed with the current recommendations. The patient also was  on Ativan, and she had not received that. She did briefly get agitated during hospitalization. However, I have discussed the case with her daughter, and she feels that the patient is back to baseline and is stable for discharge.   DISCHARGE MEDICATIONS:  1. Amlodipine 10 daily.  2. Colace 240 at bedtime.  3. Aspirin 81 one tab p.o. daily. 4. Daily-Vite 1 tab p.o. daily.  5. Vitamin D 1000 international units 2 tabs daily.  6. Iron sulfate 325 one tab p.o. b.i.d. 7. Tums 500 mg 2 tabs at bedtime.  8. Ibuprofen 600 t.i.d. as needed.  9. Promethazine 12.5 q.6 p.r.n. 10. Zofran 4 mg 1 tab p.o. t.i.d. as needed.  11. Enalapril 30 mg daily.  12. Venlafaxine 100 daily. 13. Tylenol 650 t.i.d. p.r.n. 14. Voltaren topically as needed.  15. Omeprazole 20 daily. 16. Lorazepam 0.5 q.6 p.r.n. 17. Levaquin 500 one tab p.o. q.24 x4 days.   DIET: Low sodium, low fat, low cholesterol, pureed, nectar-thick liquids.   ACTIVITY: As tolerated.   FOLLOWUP: With primary MD in 1 to 2 weeks. Speech to follow and swallow evaluation next week.   TIME SPENT: 40 minutes.   ____________________________ Lacie ScottsShreyang H. Allena KatzPatel, MD shp:lb D: 06/13/2013 10:24:05 ET T: 06/13/2013 11:19:30 ET JOB#: [id number]  cc: Kuba Shepherd H. Allena KatzPatel, MD, <Dictator> Charise CarwinSHREYANG H Said Rueb MD ELECTRONICALLY SIGNED 06/16/2013 12:11

## 2014-05-23 NOTE — H&P (Signed)
PATIENT NAME:  Cheryl Moreno, VAUX MR#:  [id number] DATE OF BIRTH:  1922-12-23  DATE OF ADMISSION:  06/11/2013  PRIMARY CARE PHYSICIAN: Louretta Parma, NP  REFERRING EMERGENCY ROOM PHYSICIAN: Rockne Menghini, MD  CHIEF COMPLAINT: Loss of speech.   HISTORY OF PRESENT ILLNESS: An 81 year old female who has history of hypertension, left basal ganglia stroke, incontinent and wheelchair-bound but very social and otherwise active, lives at [facility] assisted living facility. History obtained from patient's daughter, Mrs. Wannetta Sender, her number is [phone], who is also healthcare power of attorney and present in the room. As per daughter, the patient's usual routine morning is get up early and get ready, then she is on the wheelchair and she very social with other residents, goes for breakfast and talks to them. Today, till 9:00 or 9:30, she was sleeping which is unusual for her. After that, she woke up and did her morning stuff but then remained more sleepy throughout the day. Finally, at the nursing home people noticed that she has some speech problem at 3:00 and so they gave her a call at 4:00 and brought her to the Emergency Room for further evaluation. After the patient was brought to the Emergency Room, she was aphasic and was not able to express herself but, as per daughter who is present in the room, initially when the ER physician was evaluating her, she was following commands.  She also stuck out her tongue, she raised her hands on telling and she was also squeezing ER physician's hand on asking for it.  A neurology consult was also called in and as the patient presented after more than 7 or 8 hours to the Emergency Room, suggested no TPA and her NIH score was also low. Hospitalist services were contacted for further evaluation.   REVIEW OF SYSTEMS: Unable to get as the patient is not able to tell me anything.   PAST MEDICAL HISTORY:  1.  Hypertension.  2.  Left basal ganglia cerebrovascular  accident, residual right-sided weakness.   SOCIAL HISTORY: The patient denies any alcohol, smoking, drugs. Lives in the [facility] at assisted living facility and wheelchair-bound.   FAMILY HISTORY: Brother had lung cancer. Almost all other siblings and parents died from complications of old age like fractures and pneumonia.   HOME MEDICATION:  1.  Venlafaxine 100 mg oral once a day.  2.  TUMS 500 mg oral tablet 2 tablets once a day.  3.  Promethazine 12.5 mg oral tablet every 6 hours as needed for nausea and vomiting.  4.  Ondansetron 4 mg oral tablet 3 times a day.  5.  Omeprazole 20 mg oral once a day.  6.  Lorazepam 0.5 mg oral tablet every 6 hours as needed for anxiety.  7.  Ibuprofen 600 mg oral 3 times a day.  8.  Ferrous sulfate 325 mg oral 2 times a day.  9.  Enalapril 30 mg oral once a day.  10.  Docusate calcium 240 mg oral once a day.  11.  Cholecalciferol 1000 international units take 2 tablets once a day.  12.  Aspirin 81 mg oral once a day.  13.  Amlodipine 10 mg oral once a day.  14.  Acetaminophen 325 mg oral 2 tablets 3 times a day.   PHYSICAL EXAMINATION: VITAL SIGNS: In the ER, temperature 98.2, respiratory rate 20, blood pressure 210/99, pulse oximetry 97% on room air.  GENERAL: The patient is alert but does not appear to be oriented. She is aphasic so  hard to check her answer but when I asked her to follow any commands or stick her tongue out, squeeze my hand or raise her hand, she just nodded her head saying like yes in response but not doing what I told her to do.  HEENT: Head and neck atraumatic. Conjunctiva pink. Oral mucosa moist.  NECK: Supple. No JVD. Trachea midline.  RESPIRATORY: Bilateral equal and clear air entry.  CARDIOVASCULAR: S1, S2 present, regular. No murmur.  ABDOMEN: Soft, nontender. Bowel sounds present. No organomegaly.  SKIN: No rashes, acne or lesions.  MUSCULOSKELETAL: No pain or swelling in the joints.  NEUROLOGICAL: She is aphasic. Cranial  nerves appear grossly normal but was not able to check her tongue movements and eye movements as she is not able to follow my commands due to her problem in perception of speech, that is what it appears to me. She is also not following commands but moves her limbs very slowly and so appears that power is 3 out of 5. No rigidity or tremors.  PSYCHIATRIC: Unable to assess because of aphasia.  LEGS: No edema.   LABORATORY, DIAGNOSTIC AND RADIOLOGICAL DATA:   1.  Glucose 130, BUN 13, creatinine 0.7, sodium 136, potassium 3.9, chloride is 102, CO2 is 25, calcium is 9.1.  2.  Total protein 8.3, albumin 3.6, bilirubin 0.5, alkaline phosphate 104, SGOT 22, SGPT 21.  3.  Troponin less than 0.02.  4.  WBC 21.5, hemoglobin 15.1, platelet count is 312.  5.  INR is 1.  6.  Urinalysis is negative with 1 WBC.  7.  CT scan of the head without contrast is done which is no evidence of acute intracranial abnormality   ASSESSMENT AND PLAN: An 81 year old female who presented with a sudden-onset speech problem, likely stroke.  1.  Acute cerebrovascular accident as evident by this new onset of speech perception problem and aphasia.  We will keep her on telemetry and monitor her. We will have to do speech and swallow evaluation and physical therapy evaluation. We will get MRI, echocardiogram and carotid Doppler studies. Neurologic consult is already done by Dr. Loretha BrasilZeylikman. Not a candidate for tissue plasminogen activator because of very low NIH score days and old age and presenting after the window. Currently, because of aphasia, we will keep her n.p.o. but after speech and swallow evaluation we will decide further about her feeding issue.  2.  Uncontrolled hypertension. Due to  acute stroke, will keep permissive hypertension up to 211/110 and from tomorrow onwards will start her blood pressure medication regularly to help control pressure better.   3.  Depression. Continue Effexor  4.  Leukocytosis. Mostly this is due to  the stress of acute stroke. Continue monitoring. Currently, there is no source of infection.  5.  CODE STATUS is DO NOT RESUSCITATE, confirmed with patient's daughter, who is present in the room, Ms. Wannetta SenderBrenda Morris, who is healthcare power of attorney. Number is [phone].   TOTAL TIME SPENT ON THIS ADMISSION: 50 minutes.    ____________________________ Hope PigeonVaibhavkumar G. Elisabeth PigeonVachhani, MD vgv:cs D: 06/11/2013 20:21:14 ET T: 06/11/2013 20:46:36 ET JOB#: [id number]  cc: Hope PigeonVaibhavkumar G. Elisabeth PigeonVachhani, MD, <Dictator> Altamese DillingVAIBHAVKUMAR Diamonte Stavely MD ELECTRONICALLY SIGNED 06/24/2013 16:30

## 2014-09-04 ENCOUNTER — Emergency Department: Payer: Medicare Other

## 2014-09-04 ENCOUNTER — Emergency Department
Admission: EM | Admit: 2014-09-04 | Discharge: 2014-09-04 | Disposition: A | Discharge status: Home / Self Care | Payer: Medicare Other | Attending: Emergency Medicine | Admitting: Emergency Medicine

## 2014-09-04 DIAGNOSIS — Y998 Other external cause status: Secondary | ICD-10-CM | POA: Diagnosis not present

## 2014-09-04 DIAGNOSIS — W1839XA Other fall on same level, initial encounter: Secondary | ICD-10-CM | POA: Insufficient documentation

## 2014-09-04 DIAGNOSIS — Z043 Encounter for examination and observation following other accident: Secondary | ICD-10-CM | POA: Insufficient documentation

## 2014-09-04 DIAGNOSIS — Y9389 Activity, other specified: Secondary | ICD-10-CM | POA: Diagnosis not present

## 2014-09-04 DIAGNOSIS — I10 Essential (primary) hypertension: Secondary | ICD-10-CM | POA: Insufficient documentation

## 2014-09-04 DIAGNOSIS — G459 Transient cerebral ischemic attack, unspecified: Secondary | ICD-10-CM | POA: Diagnosis not present

## 2014-09-04 DIAGNOSIS — Z7982 Long term (current) use of aspirin: Secondary | ICD-10-CM | POA: Diagnosis not present

## 2014-09-04 DIAGNOSIS — Y92128 Other place in nursing home as the place of occurrence of the external cause: Secondary | ICD-10-CM | POA: Diagnosis not present

## 2014-09-04 DIAGNOSIS — W19XXXA Unspecified fall, initial encounter: Secondary | ICD-10-CM

## 2014-09-04 DIAGNOSIS — Z79899 Other long term (current) drug therapy: Secondary | ICD-10-CM | POA: Diagnosis not present

## 2014-09-04 DIAGNOSIS — F0390 Unspecified dementia without behavioral disturbance: Secondary | ICD-10-CM | POA: Diagnosis not present

## 2014-09-04 HISTORY — DX: Hypokalemia: E87.6

## 2014-09-04 HISTORY — DX: Essential (primary) hypertension: I10

## 2014-09-04 LAB — COMPREHENSIVE METABOLIC PANEL
ALK PHOS: 97 U/L (ref 38–126)
ALT: 20 U/L (ref 14–54)
AST: 25 U/L (ref 15–41)
Albumin: 4 g/dL (ref 3.5–5.0)
Anion gap: 9 (ref 5–15)
BUN: 14 mg/dL (ref 6–20)
CHLORIDE: 105 mmol/L (ref 101–111)
CO2: 26 mmol/L (ref 22–32)
CREATININE: 0.77 mg/dL (ref 0.44–1.00)
Calcium: 8.8 mg/dL — ABNORMAL LOW (ref 8.9–10.3)
GFR calc Af Amer: >60 mL/min (ref >60)
GFR calc non Af Amer: >60 mL/min (ref >60)
Glucose, Bld: 109 mg/dL — ABNORMAL HIGH (ref 65–99)
POTASSIUM: 3.9 mmol/L (ref 3.5–5.1)
Sodium: 140 mmol/L (ref 135–145)
Total Bilirubin: 0.3 mg/dL (ref 0.3–1.2)
Total Protein: 7.5 g/dL (ref 6.5–8.1)

## 2014-09-04 LAB — URINALYSIS COMPLETE WITH MICROSCOPIC (ARMC ONLY)
Bacteria, UA: NONE SEEN
Bilirubin Urine: NEGATIVE
Glucose, UA: NEGATIVE mg/dL
Hgb urine dipstick: NEGATIVE
Leukocytes, UA: NEGATIVE
Nitrite: NEGATIVE
Protein, ur: NEGATIVE mg/dL
Specific Gravity, Urine: 1.011 (ref 1.005–1.030)
Squamous Epithelial / LPF: NONE SEEN
pH: 7 (ref 5.0–8.0)

## 2014-09-04 LAB — CBC WITH DIFFERENTIAL/PLATELET
BASOS ABS: 0 10*3/uL (ref 0–0.1)
Basophils Relative: 0 %
EOS ABS: 0.1 10*3/uL (ref 0–0.7)
Eosinophils Relative: 1 %
HCT: 44.1 % (ref 35.0–47.0)
Hemoglobin: 14.8 g/dL (ref 12.0–16.0)
Lymphocytes Relative: 21 %
Lymphs Abs: 1.7 10*3/uL (ref 1.0–3.6)
MCH: 29.6 pg (ref 26.0–34.0)
MCHC: 33.4 g/dL (ref 32.0–36.0)
MCV: 88.6 fL (ref 80.0–100.0)
Monocytes Absolute: 0.8 10*3/uL (ref 0.2–0.9)
Monocytes Relative: 10 %
NEUTROS ABS: 5.7 10*3/uL (ref 1.4–6.5)
Neutrophils Relative %: 68 %
PLATELETS: 254 10*3/uL (ref 150–440)
RBC: 4.98 MIL/uL (ref 3.80–5.20)
RDW: 13.2 % (ref 11.5–14.5)
WBC: 8.3 10*3/uL (ref 3.6–11.0)

## 2014-09-04 LAB — TROPONIN I: Troponin I: <0.03 ng/mL (ref <0.031)

## 2014-09-04 NOTE — ED Provider Notes (Signed)
[facility] Emergency Department Provider Note  ____________________________________________  Time seen: Approximately 6:28 PM  I have reviewed the triage vital signs and the nursing notes.   HISTORY  Chief Complaint Fall    HPI Cheryl Moreno is a 81 y.o. female patient sent from nursing home for evaluation of fall she's been found on the floor patient is had said she was uncertain how long she had been there. Nurse went to talk to her and found patient awake alert didn't think she hit her head but had not her head. When I went in to see the patient afterward patient's speech was confused and hard to understand. This is apparently new. Patient has just gone for CT scan   Past Medical History  Diagnosis Date  . Osteoporosis   . Gout   . Hypertension   . Hypokalemia     Patient Active Problem List   Diagnosis Date Noted  . Pain in limb 11/13/2012  . Dermatophytosis of nail 11/13/2012    Past Surgical History  Procedure Laterality Date  . Back surgery    . Hip surgery      Current Outpatient Rx  Name  Route  Sig  Dispense  Refill  . acetaminophen (TYLENOL) 325 MG tablet   Oral   Take 650 mg by mouth every 4 (four) hours as needed for pain.         Marland Kitchen amLODipine (NORVASC) 10 MG tablet   Oral   Take 10 mg by mouth daily.         Marland Kitchen aspirin 81 MG tablet   Oral   Take 81 mg by mouth daily.         . calcium carbonate (TUMS - DOSED IN MG ELEMENTAL CALCIUM) 500 MG chewable tablet   Oral   Chew 2 tablets by mouth at bedtime.         . cephALEXin (KEFLEX) 500 MG capsule   Oral   Take 500 mg by mouth. TAKE 4 CAPSULES BY MOUTH 1 HOUR PRIOR TO DENTAL PROCEDURES         . Cholecalciferol (VITAMIN D-3) 1000 UNITS CAPS   Oral   Take by mouth. TAKE 2 TABLETS BY MOUTH ONCE DAILY FOR SUPPLEMENT         . docusate calcium (SURFAK) 240 MG capsule   Oral   Take 240 mg by mouth at bedtime.         . enalapril (VASOTEC) 20 MG tablet  Oral   Take 20 mg by mouth daily.         . ferrous sulfate 325 (65 FE) MG tablet   Oral   Take 325 mg by mouth 2 (two) times daily.         Marland Kitchen LORazepam (ATIVAN) 0.5 MG tablet   Oral   Take 0.5 mg by mouth every 6 (six) hours as needed for anxiety.         . Multiple Vitamin (DAILY VITE PO)   Oral   Take by mouth. TAKE 1 BY MOUTH ONCE DAILY         . OxyCODONE HCl, Abuse Deter, 5 MG TABA   Oral   Take 5 mg by mouth every 4 (four) hours.         Bernadette Hoit Sodium (SENEXON-S PO)   Oral   Take by mouth 2 (two) times daily.         Marland Kitchen venlafaxine (EFFEXOR) 75 MG tablet   Oral  Take 75 mg by mouth daily.           Allergies Sulfa antibiotics  No family history on file.  Social History History  Substance Use Topics  . Smoking status: Never Smoker   . Smokeless tobacco: Never Used  . Alcohol Use: No    Review of Systems Constitutional: No fever/chills Eyes: No visual changes. ENT: No sore throat. Cardiovascular: Denies chest pain. Respiratory: Denies shortness of breath. Gastrointestinal: No abdominal pain.  No nausea, no vomiting.  No diarrhea.  No constipation. Genitourinary: Negative for dysuria. Musculoskeletal: Negative for back pain. Skin: Negative for rash. Neurological: Negative for headaches, focal weakness or numbness although the neurological exam is limited by the patient's dementia and some difficulty following instructions  10-point ROS otherwise negative.  ____________________________________________   PHYSICAL EXAM:  VITAL SIGNS: ED Triage Vitals  Enc Vitals Group     BP 09/04/14 1741 192/78 mmHg     Pulse Rate 09/04/14 1741 92     Resp 09/04/14 1741 16     Temp 09/04/14 1741 98.2 F (36.8 C)     Temp Source 09/04/14 1741 Oral     SpO2 09/04/14 1738 94 %     Weight 09/04/14 1741 112 lb (50.803 kg)     Height 09/04/14 1741  (1.6 m)     Head Cir --      Peak Flow --      Pain Score 09/04/14 1742 0      Pain Loc --      Pain Edu? --      Excl. in GC? --   Constitutional: Alert and oriented. Well appearing and in no acute distress. Eyes: Conjunctivae are normal. PERRL. EOMI. Head: Atraumatic. Nose: No congestion/rhinnorhea. Mouth/Throat: Mucous membranes are moist.  Oropharynx non-erythematous. Neck: No stridor. Cardiovascular: Normal rate, regular rhythm. Grossly normal heart sounds.  Good peripheral circulation. Respiratory: Normal respiratory effort.  No retractions. Lungs CTAB. Gastrointestinal: Soft and nontender. No distention. No abdominal bruits. No CVA tenderness. Musculoskeletal: No lower extremity tenderness nor edema.  No joint effusions. Neurologic: No gross focal neurologic deficits are appreciated except for a speech deficit Skin:  Skin is warm, dry and intact. No rash noted.   ____________________________________________   LABS (all labs ordered are listed, but only abnormal results are displayed)  Labs Reviewed  COMPREHENSIVE METABOLIC PANEL - Abnormal; Notable for the following:    Glucose, Bld 109 (*)    Calcium 8.8 (*)    All other components within normal limits  URINALYSIS COMPLETEWITH MICROSCOPIC ([facility] ONLY) - Abnormal; Notable for the following:    Color, Urine YELLOW (*)    APPearance CLEAR (*)    Ketones, ur TRACE (*)    All other components within normal limits  URINE CULTURE  TROPONIN I  CBC WITH DIFFERENTIAL/PLATELET   ____________________________________________  EKG  EKG read and interpreted by me shows normal sinus rhythm at a rate of 89 left axis no acute changes ____________________________________________  RADIOLOGY  CT is read as normal. Chest x-ray shows no acute disease ____________________________________________   PROCEDURES   ____________________________________________   INITIAL IMPRESSION / ASSESSMENT AND PLAN / ED COURSE  Pertinent labs & imaging results that were available during my care of the patient were  reviewed by me and considered in my medical decision making (see chart for details). Discussed with the patient's daughter. Patient now has normal speech again. Daughter says the patient's speech has become is been going in and out from being started  and not slurring for quite some time patient had a stroke some time ago and a few months after that the slurry speech started to come and go. Daughter is worried about patient having UTI patient was verbally aggressive towards her today which is very unusual. On further discussion with the daughter says that the slurry speech yesterday and today has been much worse and does not improve when the daughter says to speak more slowly as he usually does. ____________________________________________   FINAL CLINICAL IMPRESSION(S) / ED DIAGNOSES  Final diagnoses:  Fall, initial encounter  Transient cerebral ischemia, unspecified transient cerebral ischemia type      Arnaldo Natal, MD 09/04/14 [phone]

## 2014-09-04 NOTE — Discharge Instructions (Signed)
Fall Prevention in Hospitals As a hospital patient, your condition and the treatments you receive can increase your risk for falls. Some additional risk factors for falls in a hospital include:  Being in an unfamiliar environment.  Being on bed rest.  Your surgery.  Taking certain medicines.  Your tubing requirements, such as intravenous (IV) therapy or catheters. It is important that you learn how to decrease fall risks while at the hospital. Below are important tips that can help prevent falls. SAFETY TIPS FOR PREVENTING FALLS Talk about your risk of falling.  Ask your caregiver why you are at risk for falling. Is it your medicine, illness, tubing placement, or something else?  Make a plan with your caregiver to keep you safe from falls.  Ask your caregiver or pharmacist about side effect of your medicines. Some medicines can make you dizzy or affect your coordination. Ask for help.  Ask for help before getting out of bed. You may need to press your call button.  Ask for assistance in getting you safely to the toilet.  Ask for a walker or cane to be put at your bedside. Ask that most of the side rails on your bed be placed up before your caregiver leaves the room.  Ask family or friends to sit with you.  Ask for things that are out of your reach, such as your glasses, hearing aids, telephone, bedside table, or call button. Follow these tips to avoid falling:  Stay lying or seated, rather than standing, while waiting for help.  Wear rubber-soled slippers or shoes whenever you walk in the hospital.  Avoid quick, sudden movements.  Change positions slowly.  Sit on the side of your bed before standing.  Stand up slowly and wait before you start to walk.  Let your caregiver know if there is a spill on the floor.  Pay careful attention to the medical equipment, electrical cords, and tubes around you.  When you need help, use your call button by your bed or in the  bathroom. Wait for one of your caregivers to help you.  If you feel dizzy or unsure of your footing, return to bed and wait for assistance.  Avoid being distracted by the TV, telephone, or another person in your room.  Do not lean or support yourself on rolling objects, such as IV poles or bedside tables. Document Released: 01/14/2000 Document Revised: 01/03/2012 Document Reviewed: 09/24/2011 [facility] Patient Information 2015 [place], [place]. This information is not intended to replace advice given to you by your health care provider. Make sure you discuss any questions you have with your health care provider.   PLEASE RETURN FOR ANY OTHER PROBLEMS. FOLLOW UP WITH HER DOCTOR.

## 2014-09-04 NOTE — Consult Note (Signed)
[facility] - [place] at [facility]   PATIENT NAME: Cheryl Moreno    MR#:  [id number]  DATE OF BIRTH:  1922/12/21  DATE OF CONSULT:  09/04/2014  PRIMARY CARE PHYSICIAN: Shelda Pal, MD   REQUESTING/REFERRING PHYSICIAN: Dr. Darnelle Catalan  CHIEF COMPLAINT:   Chief Complaint  Patient presents with  . Fall   altered mental status/fall  HISTORY OF PRESENT ILLNESS:  Cheryl Moreno  is a 81 y.o. female with a known history of osteoporosis, gout, hypertension who [facility] a [facility]. Patient apparently has a history of a stroke and can't use her right leg and is normally wheelchair-bound. This afternoon patient attempted to ambulate although she never does and fell. She was sent to the ER for further evaluation. In the emergency room patient was noted to have fluctuating mental status changes and hospitalist services were consulted. Upon further questioning patient is alert and follows simple commands and denies any headache, numbness, tingling or any acute neurologic symptoms. When directed she can answer all questions appropriately.  PAST MEDICAL HISTORY:   Past Medical History  Diagnosis Date  . Osteoporosis   . Gout   . Hypertension   . Hypokalemia     PAST SURGICAL HISTOIRY:   Past Surgical History  Procedure Laterality Date  . Back surgery    . Hip surgery      SOCIAL HISTORY:   History  Substance Use Topics  . Smoking status: Never Smoker   . Smokeless tobacco: Never Used  . Alcohol Use: No    FAMILY HISTORY:  No family history on file.  DRUG ALLERGIES:   Allergies  Allergen Reactions  . Sulfa Antibiotics Other (See Comments)    Reaction:  Unknown     REVIEW OF SYSTEMS:   Review of Systems  Constitutional: Negative for fever and weight loss.  HENT: Negative for congestion, nosebleeds and tinnitus.   Eyes: Negative for blurred vision, double vision and redness.  Respiratory: Negative for  cough, hemoptysis and shortness of breath.   Cardiovascular: Negative for chest pain, orthopnea, leg swelling and PND.  Gastrointestinal: Negative for nausea, vomiting, abdominal pain, diarrhea and melena.  Genitourinary: Negative for dysuria, urgency and hematuria.  Musculoskeletal: Positive for falls. Negative for joint pain.  Neurological: Negative for dizziness, tingling, sensory change, focal weakness, seizures, weakness and headaches.  Endo/Heme/Allergies: Negative for polydipsia. Does not bruise/bleed easily.  Psychiatric/Behavioral: Negative for depression and memory loss. The patient is not nervous/anxious.      MEDICATIONS AT HOME:   Prior to Admission medications   Medication Sig Start Date End Date Taking? Authorizing Provider  acetaminophen (TYLENOL) 325 MG tablet Take 650 mg by mouth 3 (three) times daily as needed for mild pain.    Yes Historical Provider, MD  amLODipine (NORVASC) 10 MG tablet Take 10 mg by mouth daily. Hold for SBP < 100.   Yes Historical Provider, MD  aspirin 81 MG chewable tablet Chew 81 mg by mouth daily.   Yes Historical Provider, MD  docusate calcium (SURFAK) 240 MG capsule Take 240 mg by mouth at bedtime.   Yes Historical Provider, MD  enalapril (VASOTEC) 20 MG tablet Take 40 mg by mouth at bedtime.    Yes Historical Provider, MD  ferrous sulfate 325 (65 FE) MG tablet Take 325 mg by mouth daily.    Yes Historical Provider, MD  LORazepam (ATIVAN) 0.5 MG tablet Take 0.5 mg by mouth every 6 (six) hours as  needed (for agitation).    Yes Historical Provider, MD  metoprolol (LOPRESSOR) 50 MG tablet Take 50 mg by mouth 2 (two) times daily.   Yes Historical Provider, MD  venlafaxine (EFFEXOR) 100 MG tablet Take 100 mg by mouth daily.   Yes Historical Provider, MD      VITAL SIGNS:  Blood pressure 172/88, pulse 95, temperature 98.4 F (36.9 C), temperature source Oral, resp. rate 20, height 5\' 3"  (1.6 m), weight 50.803 kg (112 lb), SpO2 93 %.  PHYSICAL  EXAMINATION:  GENERAL:  81 y.o.-year-old patient lying in the bed with no acute distress.  EYES: Pupils equal, round, reactive to light and accommodation. No scleral icterus. Extraocular muscles intact.  HEENT: Head atraumatic, normocephalic. Oropharynx and nasopharynx clear.  NECK:  Supple, no jugular venous distention. No thyroid enlargement, no tenderness.  LUNGS: Normal breath sounds bilaterally, no wheezing, rales, rhonchi . No use of accessory muscles of respiration.  CARDIOVASCULAR: S1, S2, RRR. 2/6 systolic ejection murmur at the right sternal border, no rubs, gallops, clicks.  ABDOMEN: Soft, nontender, nondistended. Bowel sounds present. No organomegaly or mass.  EXTREMITIES: No pedal edema, cyanosis, or clubbing.  NEUROLOGIC: Cranial nerves II through XII are intact. Right lower extremity weakness which is about a 2 out of 5 which is chronic. No other focal motor or sensory deficits patient bilaterally. PSYCHIATRIC: The patient is alert and oriented x 2. Good affect SKIN: No obvious rash, lesion, or ulcer.   LABORATORY PANEL:   CBC  Recent Labs Lab 09/04/14 1800  WBC 8.3  HGB 14.8  HCT 44.1  PLT 254   ------------------------------------------------------------------------------------------------------------------  Chemistries   Recent Labs Lab 09/04/14 1800  NA 140  K 3.9  CL 105  CO2 26  GLUCOSE 109*  BUN 14  CREATININE 0.77  CALCIUM 8.8*  AST 25  ALT 20  ALKPHOS 97  BILITOT 0.3   ------------------------------------------------------------------------------------------------------------------  Cardiac Enzymes  Recent Labs Lab 09/04/14 1800  TROPONINI <0.03   ------------------------------------------------------------------------------------------------------------------  RADIOLOGY:  Ct Head Wo Contrast  09/04/2014   CLINICAL DATA:  Larey Seat.  Hit head.  EXAM: CT HEAD WITHOUT CONTRAST  TECHNIQUE: Contiguous axial images were obtained from the base of  the skull through the vertex without intravenous contrast.  COMPARISON:  06/11/2013  FINDINGS: Stable age related cerebral atrophy, ventriculomegaly and periventricular white matter disease. No extra-axial fluid collections are identified. No CT findings for acute hemispheric infarction or intracranial hemorrhage. No mass lesions. The brainstem and cerebellum are normal.  No acute fracture is identified. The paranasal sinuses and mastoid air cells are clear. The globes are intact.  IMPRESSION: Age related cerebral atrophy, ventriculomegaly and periventricular white matter disease, unchanged.  No acute intracranial findings or skull fracture.   Electronically Signed   By: Rudie Meyer M.D.   On: 09/04/2014 18:40   Dg Chest Portable 1 View  09/04/2014   CLINICAL DATA:  Fall.  Brought in by EMS.  Initial encounter.  EXAM: PORTABLE CHEST - 1 VIEW  COMPARISON:  03/21/2012.  FINDINGS: 1829 hours. The heart size and mediastinal contours are stable with aortic atherosclerosis and mild tortuosity. The lungs are clear. There is no pleural effusion or pneumothorax. Old rib fractures are noted bilaterally. There is stable posttraumatic deformity of the right humeral diaphysis. No acute osseous findings seen.  IMPRESSION: No active cardiopulmonary process or acute findings demonstrated.   Electronically Signed   By: Carey Bullocks M.D.   On: 09/04/2014 18:43     IMPRESSION AND PLAN:  81 year old female with past medical history of hypertension, gout, osteoarthritis, history of previous CVA who presented to the hospital for a fall.  #1 altered mental status-this is probably related to some mild cognitive decline. There is no evidence of any acute neurologic or infectious source to explain her mental status change.  -Patient CT head in the ER is negative for any acute pathology. Her urinalysis is negative for UTI. -She likely has some early cognitive decline and probably needs more supervision and maybe higher level  of care than the assisted living.  #2 fall-this is likely a result the patient's mental status disturbance. Patient likely needs some supervision as mentioned and maybe higher level of care.  #3 hypertension-continue metoprolol, amlodipine, enalapril.  #4 anxiety-continue Ativan.  #5 chronic anemia-continue iron supplements.   Discussed the plan with patient's daughter and she is okay for discharge back to the [facility] assisted living. Also discussed the plan with the ER physician Dr. Darnelle Catalan and he is in agreement.  All the records are reviewed and case discussed with Consulting provider. Management plans discussed with the patient, family and they are in agreement.  CODE STATUS: Full  TOTAL TIME TAKING CARE OF THIS PATIENT: 45 minutes.    Houston Siren M.D on 09/04/2014 at 8:57 PM  Between 7am to 6pm - Pager - [phone]  After 6pm go to [email or web address] - password [email or web address] [facility]  [facility]  Office  [phone]  CC: Primary care Physician: Shelda Pal, MD

## 2014-09-04 NOTE — ED Notes (Signed)
Attempted to call pt family that pt belongings left at hospital.  Will attempt to call again.

## 2014-09-04 NOTE — ED Notes (Signed)
Pt brought to ED w/ c/o fall via [facility].  Pt from [facility] facility.  Per EMS, pt denies hitting head but facility reported feeling a knot.  Pt does not know how she fell or how long she was down for. Pt oriented to person but not time.

## 2014-09-07 LAB — URINE CULTURE: Culture: NO GROWTH

## 2015-08-01 ENCOUNTER — Emergency Department
Admission: EM | Admit: 2015-08-01 | Discharge: 2015-08-01 | Disposition: A | Discharge status: Home / Self Care | Payer: Medicare Other | Attending: Emergency Medicine | Admitting: Emergency Medicine

## 2015-08-01 ENCOUNTER — Encounter: Payer: Self-pay | Admitting: Emergency Medicine

## 2015-08-01 ENCOUNTER — Emergency Department: Payer: Medicare Other

## 2015-08-01 DIAGNOSIS — M810 Age-related osteoporosis without current pathological fracture: Secondary | ICD-10-CM | POA: Diagnosis not present

## 2015-08-01 DIAGNOSIS — Z8673 Personal history of transient ischemic attack (TIA), and cerebral infarction without residual deficits: Secondary | ICD-10-CM | POA: Insufficient documentation

## 2015-08-01 DIAGNOSIS — Z5181 Encounter for therapeutic drug level monitoring: Secondary | ICD-10-CM | POA: Insufficient documentation

## 2015-08-01 DIAGNOSIS — I10 Essential (primary) hypertension: Secondary | ICD-10-CM | POA: Insufficient documentation

## 2015-08-01 DIAGNOSIS — R519 Headache, unspecified: Secondary | ICD-10-CM

## 2015-08-01 DIAGNOSIS — Z79899 Other long term (current) drug therapy: Secondary | ICD-10-CM | POA: Insufficient documentation

## 2015-08-01 DIAGNOSIS — R51 Headache: Secondary | ICD-10-CM | POA: Diagnosis present

## 2015-08-01 DIAGNOSIS — Z7982 Long term (current) use of aspirin: Secondary | ICD-10-CM | POA: Insufficient documentation

## 2015-08-01 DIAGNOSIS — I160 Hypertensive urgency: Secondary | ICD-10-CM

## 2015-08-01 HISTORY — DX: Cerebral infarction, unspecified: I63.9

## 2015-08-01 LAB — COMPREHENSIVE METABOLIC PANEL
ALBUMIN: 3.6 g/dL (ref 3.5–5.0)
ALT: 15 U/L (ref 14–54)
AST: 21 U/L (ref 15–41)
Alkaline Phosphatase: 82 U/L (ref 38–126)
Anion gap: 4 — ABNORMAL LOW (ref 5–15)
BUN: 16 mg/dL (ref 6–20)
CO2: 29 mmol/L (ref 22–32)
Calcium: 8.8 mg/dL — ABNORMAL LOW (ref 8.9–10.3)
Chloride: 106 mmol/L (ref 101–111)
Creatinine, Ser: 0.73 mg/dL (ref 0.44–1.00)
GFR calc Af Amer: >60 mL/min (ref >60)
GFR calc non Af Amer: >60 mL/min (ref >60)
GLUCOSE: 138 mg/dL — AB (ref 65–99)
POTASSIUM: 3.8 mmol/L (ref 3.5–5.1)
Sodium: 139 mmol/L (ref 135–145)
Total Bilirubin: 0.6 mg/dL (ref 0.3–1.2)
Total Protein: 6.8 g/dL (ref 6.5–8.1)

## 2015-08-01 LAB — DIFFERENTIAL
BASOS ABS: 0.1 10*3/uL (ref 0–0.1)
BASOS PCT: 1 %
EOS ABS: 0.1 10*3/uL (ref 0–0.7)
Eosinophils Relative: 2 %
Lymphocytes Relative: 27 %
Lymphs Abs: 2.4 10*3/uL (ref 1.0–3.6)
Monocytes Absolute: 0.8 10*3/uL (ref 0.2–0.9)
Monocytes Relative: 9 %
NEUTROS PCT: 61 %
Neutro Abs: 5.6 10*3/uL (ref 1.4–6.5)

## 2015-08-01 LAB — CBC
HCT: 40.5 % (ref 35.0–47.0)
Hemoglobin: 13.6 g/dL (ref 12.0–16.0)
MCH: 29.7 pg (ref 26.0–34.0)
MCHC: 33.5 g/dL (ref 32.0–36.0)
MCV: 88.6 fL (ref 80.0–100.0)
Platelets: 252 10*3/uL (ref 150–440)
RBC: 4.57 MIL/uL (ref 3.80–5.20)
RDW: 14.7 % — AB (ref 11.5–14.5)
WBC: 9 10*3/uL (ref 3.6–11.0)

## 2015-08-01 LAB — APTT: APTT: 33 s (ref 24–36)

## 2015-08-01 LAB — PROTIME-INR
INR: 1.13
Prothrombin Time: 14.7 seconds (ref 11.4–15.0)

## 2015-08-01 LAB — TROPONIN I: Troponin I: <0.03 ng/mL (ref <0.03)

## 2015-08-01 MED ORDER — ENALAPRIL MALEATE 20 MG PO TABS
20.0000 mg | ORAL_TABLET | ORAL | Status: AC
  Administered 2015-08-01: 20 mg via ORAL
  Filled 2015-08-01: qty 1

## 2015-08-01 MED ORDER — AMLODIPINE BESYLATE 5 MG PO TABS
10.0000 mg | ORAL_TABLET | ORAL | Status: AC
  Administered 2015-08-01: 10 mg via ORAL
  Filled 2015-08-01: qty 2

## 2015-08-01 NOTE — ED Notes (Signed)
NAD noted at this time, explained to patients daughter the delay, pt's daughter states understanding, states she was going to have a talk with the patient about not refusing her BP medicines at this time. Explained if they needed anything to hit the call bell.

## 2015-08-01 NOTE — ED Notes (Signed)
This RN and Mardene CelesteJoanna, EDT cleaned pt up prior to her departure and put a new brief on patient. Pt tolerated well. This RN put patient into the wheelchair. Pt's daughter states understanding of D/C instructions at this time.

## 2015-08-01 NOTE — ED Notes (Addendum)
Patient to ER for c/o severe headache. Patient crying during triage d/t pain. Patient states she has h/o migraines, but hasn't had one in years. Patient reports worst headache ever. History of stroke. States pain started yesterday.

## 2015-08-01 NOTE — ED Notes (Signed)
Pt visualized in NAD, pt's daughter remains at bedside at this time. Delay explained will continue to monitor for further needs at this time.

## 2015-08-01 NOTE — Discharge Instructions (Signed)

## 2015-08-01 NOTE — ED Notes (Signed)
Pt took BP meds and tolerated well. NAD noted. Will continue to monitor pt's BP for decrease. Explained to patient's daughter we would wait and monitor BP prior to patient being D/C. Denies needs at this time. Calm and cooperative with staff.

## 2015-08-01 NOTE — ED Provider Notes (Signed)
[facility] Emergency Department Provider Note  ____________________________________________  Time seen: Approximately 3:50 PM  I have reviewed the triage vital signs and the nursing notes.   HISTORY  Chief Complaint Headache    HPI Cheryl Moreno is a 82 y.o. female presents for the evaluation of the severe headache. For the last day she has been telling her daughter that she has a throbbing headache over the back of her head.  The patient describes that previous she was having a pounding headache over her whole head and the back of her scalp. She took a Tylenol prior to coming, and at the present time she reports that her headache has gone away completely. Patient's daughter also affirms the same that the headache has completely gone away.  Daughter was concerned and wanted to make sure she did not have a headache from something like "a stroke" because she has had a previous stroke in the past that left her with right-sided weakness.  Family and the patient have not noticed any trouble speaking, no numbness or new weakness though she has chronic weakness in the right lower leg from a hip fracture and poor conditioning. She's not had a nausea or vomiting. No chest pain shortness of breath or trouble breathing.  Patient's daughter reports that they're not sure if she took her blood pressure medicine the last few days, occasionally the patient will refuse to take her medications at home.   Past Medical History  Diagnosis Date  . Osteoporosis   . Hypertension   . Hypokalemia   . Stroke [facility])     Patient Active Problem List   Diagnosis Date Noted  . Pain in limb 11/13/2012  . Dermatophytosis of nail 11/13/2012    Past Surgical History  Procedure Laterality Date  . Hip surgery    . Bladder suspension      Current Outpatient Rx  Name  Route  Sig  Dispense  Refill  . acetaminophen (TYLENOL) 325 MG tablet   Oral   Take 650 mg by mouth 3 (three) times  daily as needed for mild pain.          Marland Kitchen amLODipine (NORVASC) 10 MG tablet   Oral   Take 10 mg by mouth every morning. Hold for SBP < 100.         Marland Kitchen aspirin 81 MG chewable tablet   Oral   Chew 81 mg by mouth daily.         Marland Kitchen docusate calcium (SURFAK) 240 MG capsule   Oral   Take 240 mg by mouth at bedtime.         Tery Sanfilippo Sodium (STOOL SOFTENER) 100 MG capsule   Oral   Take 200 mg by mouth daily as needed for constipation.         . enalapril (VASOTEC) 20 MG tablet   Oral   Take 40 mg by mouth every evening.          Marland Kitchen ENSURE PLUS (ENSURE PLUS) LIQD   Oral   Take 1 Can by mouth daily.         . meloxicam (MOBIC) 7.5 MG tablet   Oral   Take 7.5 mg by mouth daily.         . metoprolol (LOPRESSOR) 50 MG tablet   Oral   Take 75 mg by mouth 2 (two) times daily.          . Multiple Vitamins-Minerals (ICAPS) TABS   Oral  Take 1 tablet by mouth 2 (two) times daily with a meal.         . ondansetron (ZOFRAN) 4 MG tablet   Oral   Take 4 mg by mouth every 8 (eight) hours as needed for nausea or vomiting.         . venlafaxine (EFFEXOR) 100 MG tablet   Oral   Take 100 mg by mouth daily.           Allergies Sulfa antibiotics  No family history on file.  Social History Social History  Substance Use Topics  . Smoking status: Never Smoker   . Smokeless tobacco: Never Used  . Alcohol Use: No    Review of Systems Constitutional: No fever/chills Eyes: No visual changes. ENT: No sore throat. Cardiovascular: Denies chest pain. Respiratory: Denies shortness of breath. Gastrointestinal: No abdominal pain.  No nausea, no vomiting.  No diarrhea.  No constipation. Genitourinary: Negative for dysuria. Musculoskeletal: Negative for back pain. Skin: Negative for rash. Neurological: Negative for focal weakness or numbness.  10-point ROS otherwise negative.  ____________________________________________   PHYSICAL EXAM:  VITAL SIGNS: ED  Triage Vitals  Enc Vitals Group     BP 08/01/15 1237 203/94 mmHg     Pulse Rate 08/01/15 1237 83     Resp 08/01/15 1237 18     Temp 08/01/15 1237 98 F (36.7 C)     Temp Source 08/01/15 1237 Oral     SpO2 08/01/15 1237 98 %     Weight 08/01/15 1237 95 lb (43.092 kg)     Height 08/01/15 1237 5\' 1"  (1.549 m)     Head Cir --      Peak Flow --      Pain Score --      Pain Loc --      Pain Edu? --      Excl. in GC? --    Constitutional: Alert and oriented. Well appearing and in no acute distress.Patient and her daughter are both very pleasant, calm and amicable. Eyes: Conjunctivae are normal. PERRL. EOMI. Head: Atraumatic. Nose: No congestion/rhinnorhea. Mouth/Throat: Mucous membranes are moist.  Oropharynx non-erythematous. Neck: No stridor. Normal temporal artery sensation/pulsation bilateral. There is no tenderness over the temporal artery distribution bilateral. There are no carotid bruits.  Cardiovascular: Normal rate, regular rhythm. Grossly normal heart sounds.  Good peripheral circulation. Respiratory: Normal respiratory effort.  No retractions. Lungs CTAB. Gastrointestinal: Soft and nontender. No distention. No abdominal bruits. No CVA tenderness. Musculoskeletal: No lower extremity tenderness nor edema.  No joint effusions. The right lower extremity demonstrates a bowel 1 out of 5 strength, the left lower approximately 4 out of 5. Patient and family report this is normal and she has no real use of her right lower leg due to deconditioning and a previous fracture. No change. Neurologic:  Normal speech and language. No gross focal neurologic deficits are appreciated.   NIH score equals 0, performed by me at bedside. The patient has no pronator drift. The patient has normal cranial nerve exam. Extraocular movements are normal. Visual fields are normal. Patient has 5 out of 5 strength in all extremities, except for chronic weakness in the right lower leg. There is no numbness or  gross, acute sensory abnormality in the extremities bilaterally. No speech disturbance. No dysarthria. No aphasia. No ataxia. Normal finger nose finger bilat. Patient speaking in full and clear sentences.   Skin:  Skin is warm, dry and intact. No rash noted. Psychiatric: Mood and affect  are normal. Speech and behavior are normal.  ____________________________________________   LABS (all labs ordered are listed, but only abnormal results are displayed)  Labs Reviewed  CBC - Abnormal; Notable for the following:    RDW 14.7 (*)    All other components within normal limits  COMPREHENSIVE METABOLIC PANEL - Abnormal; Notable for the following:    Glucose, Bld 138 (*)    Calcium 8.8 (*)    Anion gap 4 (*)    All other components within normal limits  PROTIME-INR  APTT  DIFFERENTIAL  TROPONIN I   ____________________________________________  EKG  Reviewed and interpreted by me at 3 PM Ventricular rate 75 QRS 70 QTc 4:30 Normal sinus rhythm, moderate baseline artifact, however no ischemic changes noted. No evidence of ST elevation. No evidence of acute infarction. Questionable left ventricular hypertrophy. ____________________________________________  RADIOLOGY   CT Head Wo Contrast (Final result) Result time: 08/01/15 12:59:11   Final result by Rad Results In Interface (08/01/15 12:59:11)   Narrative:   CLINICAL DATA: Severe headache. Personal history of migraines.  EXAM: CT HEAD WITHOUT CONTRAST  TECHNIQUE: Contiguous axial images were obtained from the base of the skull through the vertex without intravenous contrast.  COMPARISON: 09/04/2014  FINDINGS: There is no evidence of intracranial hemorrhage, brain edema, or other signs of acute infarction. There is no evidence of intracranial mass lesion or mass effect. No abnormal extraaxial fluid collections are identified.  Mild diffuse cerebral atrophy is stable. Moderate to severe chronic small vessel  disease is also stable in appearance. Old tiny lacunar infarcts involving the left basal ganglia and thalamus are again noted. Ventricles are stable in size. No skull abnormality identified.  IMPRESSION: No acute intracranial abnormality.  Stable cerebral atrophy, chronic small vessel disease, and old lacunar infarcts.   Electronically Signed By: Myles Rosenthal M.D. On: 08/01/2015 12:59       ____________________________________________   PROCEDURES  Procedure(s) performed: None  Critical Care performed: No  ____________________________________________   INITIAL IMPRESSION / ASSESSMENT AND PLAN / ED COURSE  Pertinent labs & imaging results that were available during my care of the patient were reviewed by me and considered in my medical decision making (see chart for details).  Patient presents for severe headache. Now completely resolved. Stable neurologic exam, no evidence of new deficits or infarct.  Differential diagnosis includes but is not exclusive to subarachnoid hemorrhage, meningitis, encephalitis, previous head trauma, cavernous venous thrombosis, muscle tension headache, temporal arteritis, migraine or migraine equivalent, etc. she has no evidence support temporal arteritis by examination. She has a very reassuring exam, and complete resolution of her headache. She is afebrile with normal white count infectious etiologies unlikely and the fact that her symptoms are completely gone away is very reassuring.  Blood pressure may be accountable for part of her headache, though she reports it is much better now to unclear what her blood pressure was at the time when she had a severe headache earlier today. We will give her home blood pressure medicines, monitor her blood pressure, and I discussed with the patient and her family and she has already set up follow-up with doctors making house calls who will see her tomorrow morning. I anticipate discharge with careful  headache return precautions being very appropriate, and the patient and her daughter both very agreeable with this plan and precautions.  Reeval bp 183 systolic, asymptomatic after receiving her home medications.  Return precautions and treatment recommendations and follow-up discussed with the patient who is agreeable with  the plan.   ____________________________________________   FINAL CLINICAL IMPRESSION(S) / ED DIAGNOSES  Final diagnoses:  Acute nonintractable headache, unspecified headache type  Hypertensive urgency      Sharyn CreamerMark Quale, MD 08/01/15 [phone]

## 2015-08-01 NOTE — ED Notes (Signed)
NAD noted at this time. Decrease in pt's BP noted at this time. WIll continue to monitor. Pt and daughter request warm blankets, given warm blankets at this time. Denies further needs.

## 2015-12-02 ENCOUNTER — Observation Stay
Admission: EM | Admit: 2015-12-02 | Discharge: 2015-12-04 | Disposition: A | Discharge status: Skilled Nursing Facility | Payer: Medicare Other | Attending: Internal Medicine | Admitting: Internal Medicine

## 2015-12-02 ENCOUNTER — Emergency Department: Payer: Medicare Other

## 2015-12-02 ENCOUNTER — Encounter: Payer: Self-pay | Admitting: Emergency Medicine

## 2015-12-02 DIAGNOSIS — F0150 Vascular dementia without behavioral disturbance: Secondary | ICD-10-CM | POA: Diagnosis not present

## 2015-12-02 DIAGNOSIS — Z7982 Long term (current) use of aspirin: Secondary | ICD-10-CM | POA: Insufficient documentation

## 2015-12-02 DIAGNOSIS — Z79899 Other long term (current) drug therapy: Secondary | ICD-10-CM | POA: Diagnosis not present

## 2015-12-02 DIAGNOSIS — G459 Transient cerebral ischemic attack, unspecified: Secondary | ICD-10-CM | POA: Diagnosis not present

## 2015-12-02 DIAGNOSIS — I639 Cerebral infarction, unspecified: Secondary | ICD-10-CM | POA: Diagnosis not present

## 2015-12-02 DIAGNOSIS — I10 Essential (primary) hypertension: Secondary | ICD-10-CM | POA: Insufficient documentation

## 2015-12-02 DIAGNOSIS — Z66 Do not resuscitate: Secondary | ICD-10-CM | POA: Diagnosis not present

## 2015-12-02 DIAGNOSIS — R4182 Altered mental status, unspecified: Secondary | ICD-10-CM

## 2015-12-02 LAB — COMPREHENSIVE METABOLIC PANEL
ALBUMIN: 3.8 g/dL (ref 3.5–5.0)
ALT: 12 U/L — ABNORMAL LOW (ref 14–54)
ANION GAP: 14 (ref 5–15)
AST: 25 U/L (ref 15–41)
Alkaline Phosphatase: 118 U/L (ref 38–126)
BILIRUBIN TOTAL: 0.8 mg/dL (ref 0.3–1.2)
BUN: 21 mg/dL — AB (ref 6–20)
CHLORIDE: 104 mmol/L (ref 101–111)
CO2: 20 mmol/L — ABNORMAL LOW (ref 22–32)
Calcium: 8.8 mg/dL — ABNORMAL LOW (ref 8.9–10.3)
Creatinine, Ser: 0.79 mg/dL (ref 0.44–1.00)
GFR calc Af Amer: >60 mL/min (ref >60)
GLUCOSE: 76 mg/dL (ref 65–99)
POTASSIUM: 4 mmol/L (ref 3.5–5.1)
Sodium: 138 mmol/L (ref 135–145)
TOTAL PROTEIN: 7.5 g/dL (ref 6.5–8.1)

## 2015-12-02 LAB — CBC
HCT: 45.9 % (ref 35.0–47.0)
Hemoglobin: 15.4 g/dL (ref 12.0–16.0)
MCH: 30.1 pg (ref 26.0–34.0)
MCHC: 33.5 g/dL (ref 32.0–36.0)
MCV: 89.8 fL (ref 80.0–100.0)
PLATELETS: 331 10*3/uL (ref 150–440)
RBC: 5.11 MIL/uL (ref 3.80–5.20)
RDW: 13.3 % (ref 11.5–14.5)
WBC: 8.3 10*3/uL (ref 3.6–11.0)

## 2015-12-02 LAB — DIFFERENTIAL
BASOS ABS: 0 10*3/uL (ref 0–0.1)
BASOS PCT: 1 %
EOS ABS: 0 10*3/uL (ref 0–0.7)
EOS PCT: 0 %
Lymphocytes Relative: 22 %
Lymphs Abs: 1.8 10*3/uL (ref 1.0–3.6)
Monocytes Absolute: 0.5 10*3/uL (ref 0.2–0.9)
Monocytes Relative: 7 %
NEUTROS PCT: 70 %
Neutro Abs: 5.9 10*3/uL (ref 1.4–6.5)

## 2015-12-02 LAB — URINE DRUG SCREEN, QUALITATIVE (ARMC ONLY)
Amphetamines, Ur Screen: NOT DETECTED
Barbiturates, Ur Screen: NOT DETECTED
Benzodiazepine, Ur Scrn: NOT DETECTED
CANNABINOID 50 NG, UR ~~LOC~~: NOT DETECTED
Cocaine Metabolite,Ur ~~LOC~~: NOT DETECTED
MDMA (ECSTASY) UR SCREEN: NOT DETECTED
METHADONE SCREEN, URINE: NOT DETECTED
Opiate, Ur Screen: NOT DETECTED
Phencyclidine (PCP) Ur S: NOT DETECTED
TRICYCLIC, UR SCREEN: NOT DETECTED

## 2015-12-02 LAB — APTT: APTT: 25 s (ref 24–36)

## 2015-12-02 LAB — PROTIME-INR
INR: 1.04
PROTHROMBIN TIME: 13.6 s (ref 11.4–15.2)

## 2015-12-02 LAB — MRSA PCR SCREENING: MRSA by PCR: NEGATIVE

## 2015-12-02 LAB — TROPONIN I

## 2015-12-02 LAB — ETHANOL

## 2015-12-02 MED ORDER — LABETALOL HCL 5 MG/ML IV SOLN
10.0000 mg | Freq: Four times a day (QID) | INTRAVENOUS | Status: DC | PRN
Start: 2015-12-02 — End: 2015-12-05
  Administered 2015-12-02: 10 mg via INTRAVENOUS
  Filled 2015-12-02: qty 4

## 2015-12-02 MED ORDER — HEPARIN SODIUM (PORCINE) 5000 UNIT/ML IJ SOLN
5000.0000 [IU] | Freq: Three times a day (TID) | INTRAMUSCULAR | Status: DC
  Administered 2015-12-02 – 2015-12-04 (×6): 5000 [IU] via SUBCUTANEOUS
  Filled 2015-12-02 (×6): qty 1

## 2015-12-02 MED ORDER — ONDANSETRON HCL 4 MG PO TABS: 4.0000 mg | ORAL_TABLET | Freq: Four times a day (QID) | ORAL | Status: DC | PRN

## 2015-12-02 MED ORDER — BISACODYL 5 MG PO TBEC: 5.0000 mg | DELAYED_RELEASE_TABLET | Freq: Every day | ORAL | Status: DC | PRN

## 2015-12-02 MED ORDER — ACETAMINOPHEN 650 MG RE SUPP: 650.0000 mg | Freq: Four times a day (QID) | RECTAL | Status: DC | PRN

## 2015-12-02 MED ORDER — ASPIRIN 300 MG RE SUPP
300.0000 mg | Freq: Once | RECTAL | Status: AC
  Administered 2015-12-02: 300 mg via RECTAL
  Filled 2015-12-02: qty 1

## 2015-12-02 MED ORDER — TRAZODONE HCL 50 MG PO TABS: 25.0000 mg | ORAL_TABLET | Freq: Every evening | ORAL | Status: DC | PRN

## 2015-12-02 MED ORDER — LABETALOL HCL 5 MG/ML IV SOLN
INTRAVENOUS | Status: AC
  Administered 2015-12-02: 10 mg via INTRAVENOUS
  Filled 2015-12-02: qty 4

## 2015-12-02 MED ORDER — DOCUSATE SODIUM 100 MG PO CAPS
100.0000 mg | ORAL_CAPSULE | Freq: Two times a day (BID) | ORAL | Status: DC
  Administered 2015-12-04: 100 mg via ORAL
  Filled 2015-12-02: qty 1

## 2015-12-02 MED ORDER — ACETAMINOPHEN 325 MG PO TABS: 650.0000 mg | ORAL_TABLET | Freq: Four times a day (QID) | ORAL | Status: DC | PRN

## 2015-12-02 MED ORDER — ONDANSETRON HCL 4 MG/2ML IJ SOLN: 4.0000 mg | Freq: Four times a day (QID) | INTRAMUSCULAR | Status: DC | PRN

## 2015-12-02 MED ORDER — HYDRALAZINE HCL 20 MG/ML IJ SOLN
10.0000 mg | Freq: Four times a day (QID) | INTRAMUSCULAR | Status: DC | PRN
  Administered 2015-12-02: 20:00:00 10 mg via INTRAVENOUS
  Filled 2015-12-02: qty 1

## 2015-12-02 MED ORDER — HYDROCODONE-ACETAMINOPHEN 5-325 MG PO TABS: 1.0000 | ORAL_TABLET | ORAL | Status: DC | PRN

## 2015-12-02 NOTE — ED Triage Notes (Signed)
Per EMS, pt from [facility]. Pt was having left sided facial droop and weakness according to staff last night around 6pm. Family was notified and denied pt transport. This morning, facility personnel decided to send her to hospital. Pt's daughter notified. Pt normally talkative and currently has incomprehensible speech. Pt has 20g in left forearm. Pt denies pain but is very emotional.

## 2015-12-02 NOTE — H&P (Signed)
[facility] [place] [facility]   PATIENT NAME: Cheryl Moreno    MR#:  [id number]  DATE OF BIRTH:  04-17-1922  DATE OF ADMISSION:  12/02/2015  PRIMARY CARE PHYSICIAN: Pcp Not In System   REQUESTING/REFERRING PHYSICIAN: Dr. Langston MaskerShaevitz  CHIEF COMPLAINT: Right facial droop, confusion    Chief Complaint  Patient presents with  . Altered Mental Status  . Weakness    HISTORY OF PRESENT ILLNESS:  Cheryl Moreno  is a 82 y.o. female with a known history of Previous strokes, essential hypertension, comes from facility because of confusion, right facial droop. Patient has history of stroke, nonambulatory, noted to have right facial droop, confusion, patient's family refused transport yesterday as per the chart, because of continued symptoms she was brought in here today. Also noted to have some garbled speech for the first few months but got worse recently.  PAST MEDICAL HISTORY:   Past Medical History:  Diagnosis Date  . Hypertension   . Hypokalemia   . Osteoporosis   . Stroke [facility](HCC)     PAST SURGICAL HISTOIRY:   Past Surgical History:  Procedure Laterality Date  . BLADDER SUSPENSION    . HIP SURGERY      SOCIAL HISTORY:   Social History  Substance Use Topics  . Smoking status: Never Smoker  . Smokeless tobacco: Never Used  . Alcohol use No    FAMILY HISTORY:  No family history on file.  DRUG ALLERGIES:   Allergies  Allergen Reactions  . Sulfa Antibiotics Other (See Comments)    Reaction:  Unknown     REVIEW OF SYSTEMS:  Unable to obtain because patient is confused.  MEDICATIONS AT HOME:   Prior to Admission medications   Medication Sig Start Date End Date Taking? Authorizing Provider  acetaminophen (TYLENOL) 325 MG tablet Take 650 mg by mouth 3 (three) times daily as needed for mild pain.    Yes Historical Provider, MD  amLODipine (NORVASC) 10 MG tablet Take 10 mg by mouth every morning. Hold for SBP < 100.   Yes Historical Provider,  MD  aspirin 81 MG chewable tablet Chew 81 mg by mouth daily.   Yes Historical Provider, MD  Docusate Sodium (STOOL SOFTENER) 100 MG capsule Take 200 mg by mouth daily as needed for constipation.   Yes Historical Provider, MD  Melatonin 3 MG TABS Take 1 tablet by mouth at bedtime as needed.   Yes Historical Provider, MD  metoprolol (LOPRESSOR) 50 MG tablet Take 75 mg by mouth 2 (two) times daily.    Yes Historical Provider, MD  ondansetron (ZOFRAN) 4 MG tablet Take 4 mg by mouth every 8 (eight) hours as needed for nausea or vomiting.   Yes Historical Provider, MD  sertraline (ZOLOFT) 50 MG tablet Take 50 mg by mouth at bedtime.   Yes Historical Provider, MD  enalapril (VASOTEC) 20 MG tablet Take 40 mg by mouth every evening.     Historical Provider, MD  ENSURE PLUS (ENSURE PLUS) LIQD Take 1 Can by mouth daily.    Historical Provider, MD  Multiple Vitamins-Minerals (ICAPS) TABS Take 1 tablet by mouth 2 (two) times daily with a meal.    Historical Provider, MD  venlafaxine (EFFEXOR) 100 MG tablet Take 100 mg by mouth daily.    Historical Provider, MD      VITAL SIGNS:  Blood pressure (!) 200/80, pulse 84, temperature 98.1 F (36.7 C), temperature source Oral, resp. rate (!) 27, SpO2 97 %.  PHYSICAL  EXAMINATION:  GENERAL:  82 y.o.-year-old patient lying in the bed with no acute distress.  EYES: Pupils equal, round, reactive to light and accommodation. No scleral icterus. Extraocular muscles intact.  HEENT: Head atraumatic, normocephalic. Oropharynx and nasopharynx clear.  NECK:  Supple, no jugular venous distention. No thyroid enlargement, no tenderness.  LUNGS: Normal breath sounds bilaterally, no wheezing, rales,rhonchi or crepitation. No use of accessory muscles of respiration.  CARDIOVASCULAR: S1, S2 normal. No murmurs, rubs, or gallops.  ABDOMEN: Soft, nontender, nondistended. Bowel sounds present. No organomegaly or mass.  EXTREMITIES: No pedal edema, cyanosis, or clubbing.  NEUROLOGIC:  alert,foloows commads on and off. Did have some dysarthria,  Right  facial droop. Cranial nerves II through XII intact, patient unable to  lift the right leg off the bed, lifts left leg few inches of the bed. Results are intact bilaterally. DTR 2+ bilaterally.  PSYCHIATRIC: awake, slight dysarthria present SKIN: No obvious rash, lesion, or ulcer.   LABORATORY PANEL:   CBC  Recent Labs Lab 12/02/15 1322  WBC 8.3  HGB 15.4  HCT 45.9  PLT 331   ------------------------------------------------------------------------------------------------------------------  Chemistries   Recent Labs Lab 12/02/15 1322  NA 138  K 4.0  CL 104  CO2 20*  GLUCOSE 76  BUN 21*  CREATININE 0.79  CALCIUM 8.8*  AST 25  ALT 12*  ALKPHOS 118  BILITOT 0.8   ------------------------------------------------------------------------------------------------------------------  Cardiac Enzymes  Recent Labs Lab 12/02/15 1322  TROPONINI <0.03   ------------------------------------------------------------------------------------------------------------------  RADIOLOGY:  Ct Head Wo Contrast  Result Date: 12/02/2015 CLINICAL DATA:  82 year old female left facial droop and weakness since 1800 hours yesterday. Initial encounter. EXAM: CT HEAD WITHOUT CONTRAST TECHNIQUE: Contiguous axial images were obtained from the base of the skull through the vertex without intravenous contrast. COMPARISON:  CT head without contrast 08/01/2015 and earlier. FINDINGS: Study is intermittently degraded by motion artifact despite repeated imaging attempts. Brain: No acute intracranial hemorrhage identified. No midline shift, mass effect, or evidence of intracranial mass lesion. Patchy and confluent cerebral white matter hypodensity. Heterogeneous hypodensity involving the bilateral deep gray matter nuclei. No interval changes are evident. No cortically based acute infarct identified. Vascular: Calcified atherosclerosis at the  skull base. No suspicious intracranial vascular hyperdensity. Skull: No acute osseous abnormality identified. Sinuses/Orbits: Visualized paranasal sinuses and mastoids are stable and well pneumatized. Other: No acute orbit or scalp soft tissue finding. IMPRESSION: Stable non contrast CT appearance of the brain since July with fairly advanced chronic small vessel disease. Electronically Signed   By: Odessa Fleming M.D.   On: 12/02/2015 13:58    EKG:   Orders placed or performed during the hospital encounter of 12/02/15  . ED EKG  . ED EKG  . EKG 12-Lead  . EKG 12-Lead  EKG sinus rhythm at 81 bpm no ST-T changes.  IMPRESSION AND PLAN:  Assessment: 82 year old female with a right facial droop, slurred speech, confusion not back to baseline, noncompliance with medication, admit her for possible stroke: #1 admit to stroke unit under observation status, continue nothing by mouth, hold all the by mouth medications until swallow evaluationis done  Use IV  labetalol 10 mg every 6 hours for systolic blood pressure more than 170/80. Allow  the permissive hypertension for the  First  24 hours. Her to get physical therapy, speech therapy, question therapy consult, MRI, MRA of the brain without contrast, continue prophylactic aspirin 325 mg daily, monitor on telemetry, check neuro checks, Code; DO NOT RESUSCITATE.     All the  records are reviewed and case discussed with ED provider. Management plans discussed with the patient, family and they are in agreement.  CODE STATUS: DNR  TOTAL TIME TAKING CARE OF THIS PATIENT: 55minutes.    Katha HammingKONIDENA,Chilton Sallade M.D on 12/02/2015 at 4:53 PM  Between 7am to 6pm - Pager - [phone]  After 6pm go to [email or web address] - password [email or web address]  [facility]  Office  [phone]  CC: Primary care physician; Pcp Not In System  Note: This dictation was prepared with Dragon dictation along with smaller phrase technology. Any transcriptional errors that  result from this process are unintentional.

## 2015-12-02 NOTE — ED Notes (Addendum)
Patient transported to CT 

## 2015-12-02 NOTE — ED Notes (Signed)
Pt family at bedside. Pt is very emotional.

## 2015-12-02 NOTE — ED Provider Notes (Signed)
[facility] Emergency Department Provider Note   ____________________________________________   First MD Initiated Contact with Patient 12/02/15 1309     (approximate)  I have reviewed the triage vital signs and the nursing notes.   HISTORY  Chief Complaint Altered Mental Status and Weakness   HPI Cheryl Moreno is a [age] y.o. female with a history of stroke as well as hypertension is presenting with altered mental status as well as right facial droop since last night at about 6 PM. The patient's daughter is at the bedside and says that the patient had a stroke several years ago with similar symptoms but had recuperated fairly well with rehabilitation. Patient had received TPA the last time that she had a stroke several years ago. The patient is currently a DO NOT RESUSCITATE. The patient is unable to answer detailed questions from me at this time. The daughter saying that the patient usually is able to answer questions were completely and as well is able to follow simple commands.   Past Medical History:  Diagnosis Date  . Hypertension   . Hypokalemia   . Osteoporosis   . Stroke [facility](HCC)     Patient Active Problem List   Diagnosis Date Noted  . Pain in limb 11/13/2012  . Dermatophytosis of nail 11/13/2012    Past Surgical History:  Procedure Laterality Date  . BLADDER SUSPENSION    . HIP SURGERY      Prior to Admission medications   Medication Sig Start Date End Date Taking? Authorizing Provider  acetaminophen (TYLENOL) 325 MG tablet Take 650 mg by mouth 3 (three) times daily as needed for mild pain.    Yes Historical Provider, MD  amLODipine (NORVASC) 10 MG tablet Take 10 mg by mouth every morning. Hold for SBP < 100.   Yes Historical Provider, MD  aspirin 81 MG chewable tablet Chew 81 mg by mouth daily.   Yes Historical Provider, MD  Docusate Sodium (STOOL SOFTENER) 100 MG capsule Take 200 mg by mouth daily as needed for constipation.   Yes  Historical Provider, MD  Melatonin 3 MG TABS Take 1 tablet by mouth at bedtime as needed.   Yes Historical Provider, MD  metoprolol (LOPRESSOR) 50 MG tablet Take 75 mg by mouth 2 (two) times daily.    Yes Historical Provider, MD  ondansetron (ZOFRAN) 4 MG tablet Take 4 mg by mouth every 8 (eight) hours as needed for nausea or vomiting.   Yes Historical Provider, MD  sertraline (ZOLOFT) 50 MG tablet Take 50 mg by mouth at bedtime.   Yes Historical Provider, MD  enalapril (VASOTEC) 20 MG tablet Take 40 mg by mouth every evening.     Historical Provider, MD  ENSURE PLUS (ENSURE PLUS) LIQD Take 1 Can by mouth daily.    Historical Provider, MD  Multiple Vitamins-Minerals (ICAPS) TABS Take 1 tablet by mouth 2 (two) times daily with a meal.    Historical Provider, MD  venlafaxine (EFFEXOR) 100 MG tablet Take 100 mg by mouth daily.    Historical Provider, MD    Allergies Sulfa antibiotics  No family history on file.  Social History Social History  Substance Use Topics  . Smoking status: Never Smoker  . Smokeless tobacco: Never Used  . Alcohol use No    Review of Systems Caveat secondary to altered mental status.  ____________________________________________   PHYSICAL EXAM:  VITAL SIGNS: ED Triage Vitals [12/02/15 1312]  Enc Vitals Group     BP (!) 199/94  Pulse Rate 80     Resp 16     Temp 98.1 F (36.7 C)     Temp Source Oral     SpO2 96 %     Weight      Height      Head Circumference      Peak Flow      Pain Score      Pain Loc      Pain Edu?      Excl. in GC?     Constitutional: Alert.  in no acute distress. Eyes: Conjunctivae are normal. PERRL. Head: Atraumatic. Nose: No congestion/rhinnorhea. Mouth/Throat: Mucous membranes are moist.  Drooling from right side of the mouth. No swelling or abscess around the teeth. Neck: No stridor.   Cardiovascular: Normal rate, regular rhythm. Grossly normal heart sounds.  Respiratory: Normal respiratory effort.  No  retractions. Lungs CTAB. Gastrointestinal: Soft and nontender. No distention. Musculoskeletal: No lower extremity tenderness nor edema.   Neurologic:  Patient with a dysarthria. Very difficult to assess her strength because she is not following commands. Skin:  Skin is warm, dry and intact. No rash noted. Psychiatric: Mood and affect are normal. Speech and behavior are normal.  NIH Stroke Scale   Person Administering Scale: Arelia Longest  Administer stroke scale items in the order listed. Record performance in each category after each subscale exam. Do not go back and change scores. Follow directions provided for each exam technique. Scores should reflect what the patient does, not what the clinician thinks the patient can do. The clinician should record answers while administering the exam and work quickly. Except where indicated, the patient should not be coached (i.e., repeated requests to patient to make a special effort).   1a  Level of consciousness: 0=alert; keenly responsive  1b. LOC questions:  2=Performs neither task correctly  1c. LOC commands: 2=Performs neither task correctly  2.  Best Gaze: 0=normal  3.  Visual: 0=No visual loss  4. Facial Palsy: 2=Partial paralysis (total or near total paralysis of the lower face)  5a.  Motor left arm: 3=No effort against gravity, limb falls  5b.  Motor right arm: 3=No effort against gravity, limb falls  6a. motor left leg: 3=No effort against gravity, limb falls  6b  Motor right leg:  3=No effort against gravity, limb falls  7. Limb Ataxia: 0=Absent  8.  Sensory: 0=Normal; no sensory loss  9. Best Language:  1=Mild to moderate aphasia; some obvious loss of fluency or facility of comprehension without significant limitation on ideas expressed or form of expression.  10. Dysarthria: 1=Mild to moderate, patient slurs at least some words and at worst, can be understood with some difficulty  11. Extinction and Inattention: 0=No  abnormality  12. Distal motor function: 0=Normal   Total:   20   ____________________________________________   LABS (all labs ordered are listed, but only abnormal results are displayed)  Labs Reviewed  PROTIME-INR  APTT  CBC  DIFFERENTIAL  ETHANOL  COMPREHENSIVE METABOLIC PANEL  TROPONIN I  URINE DRUG SCREEN, QUALITATIVE ([facility] ONLY)   ____________________________________________  EKG  ED ECG REPORT I, Arelia Longest, the attending physician, personally viewed and interpreted this ECG.   Date: 12/02/2015  EKG Time: 1323  Rate: 81  Rhythm: normal sinus rhythm  Axis: Normal  Intervals:none  ST&T Change: No ST segment elevation or depression. No abnormal T-wave inversion.  ____________________________________________  RADIOLOGY  CT Head Wo Contrast (Accession [id number]) (Order [id number])  Imaging  Date: 12/02/2015 Department: [facility] EMERGENCY DEPARTMENT Released By/Authorizing: Myrna Blazeravid Matthew Ola Raap, MD (auto-released)  Exam Information   Status Exam Begun  Exam Ended   Final [99] 12/02/2015 1:50 PM 12/02/2015 1:52 PM  PACS Images   Show images for CT Head Wo Contrast  Study Result   CLINICAL DATA:  82 year old female left facial droop and weakness since 1800 hours yesterday. Initial encounter.  EXAM: CT HEAD WITHOUT CONTRAST  TECHNIQUE: Contiguous axial images were obtained from the base of the skull through the vertex without intravenous contrast.  COMPARISON:  CT head without contrast 08/01/2015 and earlier.  FINDINGS: Study is intermittently degraded by motion artifact despite repeated imaging attempts.  Brain: No acute intracranial hemorrhage identified. No midline shift, mass effect, or evidence of intracranial mass lesion. Patchy and confluent cerebral white matter hypodensity. Heterogeneous hypodensity involving the bilateral deep gray matter nuclei. No interval changes are evident. No cortically based  acute infarct identified.  Vascular: Calcified atherosclerosis at the skull base. No suspicious intracranial vascular hyperdensity.  Skull: No acute osseous abnormality identified.  Sinuses/Orbits: Visualized paranasal sinuses and mastoids are stable and well pneumatized.  Other: No acute orbit or scalp soft tissue finding.  IMPRESSION: Stable non contrast CT appearance of the brain since July with fairly advanced chronic small vessel disease.   Electronically Signed   By: Odessa FlemingH  Hall M.D.   On: 12/02/2015 13:58     ____________________________________________   PROCEDURES  Procedure(s) performed:   Procedures  Critical Care performed:   ____________________________________________   INITIAL IMPRESSION / ASSESSMENT AND PLAN / ED COURSE  Pertinent labs & imaging results that were available during my care of the patient were reviewed by me and considered in my medical decision making (see chart for details).  ----------------------------------------- 1:57 PM on 12/02/2015 -----------------------------------------  Patient is well outside the window for TPA. I discussed the case with Dr. Thad Rangereynolds of the neurology service who agrees with admission and will see the patient is a consult. I also discussed the need for admission with the family for further evaluation and likely physical and occupational therapy. The patient's daughter is understanding of the plan and willing to comply.  Clinical Course   ----------------------------------------- 4:22 PM on 12/02/2015 -----------------------------------------  Patient without any acute findings on testing at this time with clinical presentation of stroke with previous stroke which was similar. Will be admitted to the hospital. We'll give aspirin suppository. Seen by Dr. Thad Rangereynolds. Signed out to Dr. Elpidio AnisSudini.  ____________________________________________   FINAL CLINICAL IMPRESSION(S) / ED  DIAGNOSES  CVA.    NEW MEDICATIONS STARTED DURING THIS VISIT:  New Prescriptions   No medications on file     Note:  This document was prepared using Dragon voice recognition software and may include unintentional dictation errors.    Myrna Blazeravid Matthew Samar Venneman, MD 12/02/15 [phone]

## 2015-12-02 NOTE — Consult Note (Signed)
Referring Physician: Langston MaskerShaevitz    Chief Complaint: Right facial droop, confusion  HPI: Cheryl Moreno is an 82 y.o. female with a history of stroke who is now nonambulatory and in a facility.  Was visited by her daughter yesterday after coming back into town from a 5 day trip.  Daughter noted that the patient had a right facial droop.  She was confused and could not feed herself or brush her teeth.  Family refused transport on yesterday.  Patient with continued symptoms today and was brought in for evaluation.   Patient refuses medications unless forced by daughter so therefore may have been noncompliant with medications recently.  Speech has been garbled for the past few months but is currently worse.   At baseline unable to ambulate, incontinent.  Initial NIHSS of 9.    Date last known well: Unable to determine Time last known well: Unable to determine tPA Given: No: Unable to determine LKW  Modified Rankin: Rankin Score=5  Past Medical History:  Diagnosis Date  . Hypertension   . Hypokalemia   . Osteoporosis   . Stroke [facility](HCC)     Past Surgical History:  Procedure Laterality Date  . BLADDER SUSPENSION    . HIP SURGERY      No family history on file. Social History:  reports that she has never smoked. She has never used smokeless tobacco. She reports that she does not drink alcohol or use drugs.  Allergies:  Allergies  Allergen Reactions  . Sulfa Antibiotics Other (See Comments)    Reaction:  Unknown     Medications: I have reviewed the patient's current medications. Prior to Admission:  Prior to Admission medications   Medication Sig Start Date End Date Taking? Authorizing Provider  acetaminophen (TYLENOL) 325 MG tablet Take 650 mg by mouth 3 (three) times daily as needed for mild pain.    Yes Historical Provider, MD  amLODipine (NORVASC) 10 MG tablet Take 10 mg by mouth every morning. Hold for SBP < 100.   Yes Historical Provider, MD  aspirin 81 MG chewable tablet Chew 81 mg  by mouth daily.   Yes Historical Provider, MD  Docusate Sodium (STOOL SOFTENER) 100 MG capsule Take 200 mg by mouth daily as needed for constipation.   Yes Historical Provider, MD  Melatonin 3 MG TABS Take 1 tablet by mouth at bedtime as needed.   Yes Historical Provider, MD  metoprolol (LOPRESSOR) 50 MG tablet Take 75 mg by mouth 2 (two) times daily.    Yes Historical Provider, MD  ondansetron (ZOFRAN) 4 MG tablet Take 4 mg by mouth every 8 (eight) hours as needed for nausea or vomiting.   Yes Historical Provider, MD  sertraline (ZOLOFT) 50 MG tablet Take 50 mg by mouth at bedtime.   Yes Historical Provider, MD  enalapril (VASOTEC) 20 MG tablet Take 40 mg by mouth every evening.     Historical Provider, MD  ENSURE PLUS (ENSURE PLUS) LIQD Take 1 Can by mouth daily.    Historical Provider, MD  Multiple Vitamins-Minerals (ICAPS) TABS Take 1 tablet by mouth 2 (two) times daily with a meal.    Historical Provider, MD  venlafaxine (EFFEXOR) 100 MG tablet Take 100 mg by mouth daily.    Historical Provider, MD     ROS: History obtained from daugter  General ROS: negative for - chills, fatigue, fever, night sweats, weight gain or weight loss Psychological ROS: negative for - behavioral disorder, hallucinations, memory difficulties, mood swings or suicidal ideation Ophthalmic  ROS: negative for - blurry vision, double vision, eye pain or loss of vision ENT ROS: tooth pain Allergy and Immunology ROS: negative for - hives or itchy/watery eyes Hematological and Lymphatic ROS: negative for - bleeding problems, bruising or swollen lymph nodes Endocrine ROS: negative for - galactorrhea, hair pattern changes, polydipsia/polyuria or temperature intolerance Respiratory ROS: negative for - cough, hemoptysis, shortness of breath or wheezing Cardiovascular ROS: negative for - chest pain, dyspnea on exertion, edema or irregular heartbeat Gastrointestinal ROS: negative for - abdominal pain, diarrhea, hematemesis,  nausea/vomiting or stool incontinence Genito-Urinary ROS: negative for - dysuria, hematuria, incontinence or urinary frequency/urgency Musculoskeletal ROS: hip pain Neurological ROS: as noted in HPI Dermatological ROS: negative for rash and skin lesion changes  Physical Examination: Blood pressure (!) 199/94, pulse 80, temperature 98.1 F (36.7 C), temperature source Oral, resp. rate 16, SpO2 96 %.  HEENT-  Normocephalic, no lesions, without obvious abnormality.  Normal external eye and conjunctiva.  Normal TM's bilaterally.  Normal auditory canals and external ears. Normal external nose, mucus membranes and septum.  Normal pharynx. Cardiovascular- S1, S2 normal, pulses palpable throughout   Lungs- chest clear, no wheezing, rales, normal symmetric air entry Abdomen- soft, non-tender; bowel sounds normal; no masses,  no organomegaly Extremities- no edema Lymph-no adenopathy palpable Musculoskeletal-no joint tenderness, deformity or swelling Skin-warm and dry, no hyperpigmentation, vitiligo, or suspicious lesions  Neurological Examination Mental Status: Alert, oriented, thought content appropriate.  Speech fluent but markedly dysarthric.  Able to follow 3 step commands without some mild reinforcement. Cranial Nerves: II: Discs flat bilaterally; Visual fields grossly normal, pupils equal, round, reactive to light and accommodation III,IV, VI: ptosis not present, extra-ocular motions intact bilaterally V,VII: right facial droop, facial light touch sensation normal bilaterally VIII: hearing normal bilaterally IX,X: gag reflex present XI: bilateral shoulder shrug XII: midline tongue extension Motor: Patient with generalized weakness.  Lifts both upper extremities above her head with 5-/5 strength bilaterally.  Unable to lift RLE off the bed.  Lifts the LLE a few inches off the bed at 4/5 strength.   Sensory: Pinprick and light touch intact throughout, bilaterally Deep Tendon Reflexes: 2+  and symmetric with absent AJ's bilaterally Plantars: Right: mutegoing   Left: upgoing Cerebellar: Normal finger-to-nose testing bilaterally Gait: not tested due to patient being nonambulatory   Laboratory Studies:  Basic Metabolic Panel:  Recent Labs Lab 12/02/15 1322  NA 138  K 4.0  CL 104  CO2 20*  GLUCOSE 76  BUN 21*  CREATININE 0.79  CALCIUM 8.8*    Liver Function Tests:  Recent Labs Lab 12/02/15 1322  AST 25  ALT 12*  ALKPHOS 118  BILITOT 0.8  PROT 7.5  ALBUMIN 3.8   No results for input(s): LIPASE, AMYLASE in the last 168 hours. No results for input(s): AMMONIA in the last 168 hours.  CBC:  Recent Labs Lab 12/02/15 1322  WBC 8.3  NEUTROABS 5.9  HGB 15.4  HCT 45.9  MCV 89.8  PLT 331    Cardiac Enzymes:  Recent Labs Lab 12/02/15 1322  TROPONINI <0.03    BNP: Invalid input(s): POCBNP  CBG: No results for input(s): GLUCAP in the last 168 hours.  Microbiology: Results for orders placed or performed during the hospital encounter of 09/04/14  Urine culture     Status: None   Collection Time: 09/04/14  7:01 PM  Result Value Ref Range Status   Specimen Description URINE, RANDOM  Final   Special Requests NONE  Final   Culture  NO GROWTH  Final   Report Status 09/07/2014 FINAL  Final    Coagulation Studies:  Recent Labs  12/02/15 1322  LABPROT 13.6  INR 1.04    Urinalysis: No results for input(s): COLORURINE, LABSPEC, PHURINE, GLUCOSEU, HGBUR, BILIRUBINUR, KETONESUR, PROTEINUR, UROBILINOGEN, NITRITE, LEUKOCYTESUR in the last 168 hours.  Invalid input(s): APPERANCEUR  Lipid Panel:    Component Value Date/Time   CHOL 120 06/12/2013 0453   TRIG 66 06/12/2013 0453   HDL 64 (H) 06/12/2013 0453   VLDL 13 06/12/2013 0453   LDLCALC 43 06/12/2013 0453    HgbA1C: No results found for: HGBA1C  Urine Drug Screen:  No results found for: LABOPIA, COCAINSCRNUR, LABBENZ, AMPHETMU, THCU, LABBARB  Alcohol Level: No results for input(s):  ETH in the last 168 hours.  Other results: EKG: sinus rhythm at 81 bpm  Imaging: Ct Head Wo Contrast  Result Date: 12/02/2015 CLINICAL DATA:  82 year old female left facial droop and weakness since 1800 hours yesterday. Initial encounter. EXAM: CT HEAD WITHOUT CONTRAST TECHNIQUE: Contiguous axial images were obtained from the base of the skull through the vertex without intravenous contrast. COMPARISON:  CT head without contrast 08/01/2015 and earlier. FINDINGS: Study is intermittently degraded by motion artifact despite repeated imaging attempts. Brain: No acute intracranial hemorrhage identified. No midline shift, mass effect, or evidence of intracranial mass lesion. Patchy and confluent cerebral white matter hypodensity. Heterogeneous hypodensity involving the bilateral deep gray matter nuclei. No interval changes are evident. No cortically based acute infarct identified. Vascular: Calcified atherosclerosis at the skull base. No suspicious intracranial vascular hyperdensity. Skull: No acute osseous abnormality identified. Sinuses/Orbits: Visualized paranasal sinuses and mastoids are stable and well pneumatized. Other: No acute orbit or scalp soft tissue finding. IMPRESSION: Stable non contrast CT appearance of the brain since July with fairly advanced chronic small vessel disease. Electronically Signed   By: Odessa Fleming M.D.   On: 12/02/2015 13:58    Assessment: 82 y.o. female presenting with right facial droop, slurred speech and confusion.  Patient is some improved but still not at baseline.  Suspect acute infarct.  Patient likely noncompliant with medications.  Prescribed ASA.  Further work up recommended.   Patient is not going to be a surgical or intervention candidate based on her noncompliance and age.  Will continue with medical management.    Stroke Risk Factors - hypertension  Plan: 1. HgbA1c, fasting lipid panel 2. MRI, MRA  of the brain without contrast 3. PT consult, OT consult, Speech  consult 4. Carotid dopplers 5. Prophylactic therapy-Antiplatelet med: Aspirin - dose 325mg  daily 6. NPO until RN stroke swallow screen 7. Telemetry monitoring 8. Frequent neuro checks 9. Permissive BP for first 24 hours.     Thana Farr, MD Neurology [phone] 12/02/2015, 2:51 PM

## 2015-12-03 ENCOUNTER — Observation Stay: Payer: Medicare Other

## 2015-12-03 DIAGNOSIS — I639 Cerebral infarction, unspecified: Secondary | ICD-10-CM | POA: Diagnosis not present

## 2015-12-03 LAB — BASIC METABOLIC PANEL
Anion gap: 7 (ref 5–15)
BUN: 21 mg/dL — AB (ref 6–20)
CHLORIDE: 107 mmol/L (ref 101–111)
CO2: 24 mmol/L (ref 22–32)
CREATININE: 0.78 mg/dL (ref 0.44–1.00)
Calcium: 8.5 mg/dL — ABNORMAL LOW (ref 8.9–10.3)
GFR calc Af Amer: >60 mL/min (ref >60)
GLUCOSE: 87 mg/dL (ref 65–99)
POTASSIUM: 4.2 mmol/L (ref 3.5–5.1)
Sodium: 138 mmol/L (ref 135–145)

## 2015-12-03 LAB — HEMOGLOBIN A1C
Hgb A1c MFr Bld: 5.1 % (ref 4.8–5.6)
MEAN PLASMA GLUCOSE: 100 mg/dL

## 2015-12-03 LAB — CBC
HEMATOCRIT: 41.9 % (ref 35.0–47.0)
Hemoglobin: 13.8 g/dL (ref 12.0–16.0)
MCH: 29.8 pg (ref 26.0–34.0)
MCHC: 33 g/dL (ref 32.0–36.0)
MCV: 90.3 fL (ref 80.0–100.0)
PLATELETS: 317 10*3/uL (ref 150–440)
RBC: 4.64 MIL/uL (ref 3.80–5.20)
RDW: 13.8 % (ref 11.5–14.5)
WBC: 8.4 10*3/uL (ref 3.6–11.0)

## 2015-12-03 LAB — LIPID PANEL
Cholesterol: 144 mg/dL (ref 0–200)
HDL: 53 mg/dL (ref >40)
LDL CALC: 77 mg/dL (ref 0–99)
Total CHOL/HDL Ratio: 2.7 RATIO
Triglycerides: 69 mg/dL (ref <150)
VLDL: 14 mg/dL (ref 0–40)

## 2015-12-03 LAB — GLUCOSE, CAPILLARY
GLUCOSE-CAPILLARY: 83 mg/dL (ref 65–99)
Glucose-Capillary: 97 mg/dL (ref 65–99)

## 2015-12-03 MED ORDER — VENLAFAXINE HCL 25 MG PO TABS
100.0000 mg | ORAL_TABLET | Freq: Every day | ORAL | Status: DC
  Administered 2015-12-04: 13:00:00 100 mg via ORAL
  Filled 2015-12-03 (×2): qty 4

## 2015-12-03 MED ORDER — SERTRALINE HCL 50 MG PO TABS: 50.0000 mg | ORAL_TABLET | Freq: Every day | ORAL | Status: DC

## 2015-12-03 MED ORDER — LORAZEPAM 2 MG/ML IJ SOLN
0.5000 mg | Freq: Once | INTRAMUSCULAR | Status: AC
  Administered 2015-12-03: 0.5 mg via INTRAVENOUS
  Filled 2015-12-03: qty 1

## 2015-12-03 MED ORDER — METOPROLOL TARTRATE 50 MG PO TABS
75.0000 mg | ORAL_TABLET | Freq: Two times a day (BID) | ORAL | Status: DC
  Administered 2015-12-04: 75 mg via ORAL
  Filled 2015-12-03: qty 1

## 2015-12-03 MED ORDER — ASPIRIN 81 MG PO CHEW
324.0000 mg | CHEWABLE_TABLET | Freq: Every day | ORAL | Status: DC
  Administered 2015-12-04: 13:00:00 324 mg via ORAL
  Filled 2015-12-03: qty 4

## 2015-12-03 MED ORDER — ASPIRIN 81 MG PO CHEW: 81.0000 mg | CHEWABLE_TABLET | Freq: Every day | ORAL | Status: DC

## 2015-12-03 MED ORDER — AMLODIPINE BESYLATE 10 MG PO TABS
10.0000 mg | ORAL_TABLET | ORAL | Status: DC
  Administered 2015-12-04: 10 mg via ORAL
  Filled 2015-12-03 (×2): qty 1

## 2015-12-03 MED ORDER — ENALAPRIL MALEATE 10 MG PO TABS
40.0000 mg | ORAL_TABLET | Freq: Every evening | ORAL | Status: DC
  Administered 2015-12-04: 40 mg via ORAL
  Filled 2015-12-03: qty 4

## 2015-12-03 MED ORDER — ALPRAZOLAM 1 MG PO TABS: 1.0000 mg | ORAL_TABLET | Freq: Once | ORAL | Status: DC

## 2015-12-03 NOTE — Progress Notes (Signed)
Dr. Clint GuyHower notified patient went for MRI but was combative/agitated despite being pre-medicated. Verbal order to d/c MRI, Carotid US, and Neuro checks Q2H.

## 2015-12-03 NOTE — Progress Notes (Signed)
Subjective: Patient awake and alert.  Did not cooperate well with therapy.  She does have a history of this.  No new neurological complaints.    Objective: Current vital signs: BP (!) 169/60 (BP Location: Right Arm)   Pulse 75   Temp 98.1 F (36.7 C) (Oral)   Resp 16   Ht 5\' 3"  (1.6 m)   Wt 41.6 kg (91 lb 12.8 oz)   SpO2 96%   BMI 16.26 kg/m  Vital signs in last 24 hours: Temp:  [97.5 F (36.4 C)-98.5 F (36.9 C)] 98.1 F (36.7 C) (11/03 0221) Pulse Rate:  [64-88] 75 (11/03 1019) Resp:  [15-27] 16 (11/03 1019) BP: (150-220)/(53-94) 169/60 (11/03 1019) SpO2:  [96 %-100 %] 96 % (11/03 0732) Weight:  [41.6 kg (91 lb 12.8 oz)] 41.6 kg (91 lb 12.8 oz) (11/03 0500)  Intake/Output from previous day: No intake/output data recorded. Intake/Output this shift: No intake/output data recorded. Nutritional status: Diet NPO time specified  Neurologic Exam: Mental Status: Alert, responds to questioning concerning [facility].  Able to follow 3 step commands without some mild reinforcement. Cranial Nerves: II: Discs flat bilaterally; Visual fields grossly normal, pupils equal, round, reactive to light and accommodation III,IV, VI: ptosis not present, extra-ocular motions intact bilaterally V,VII: right facial droop, facial light touch sensation normal bilaterally VIII: hearing normal bilaterally IX,X: gag reflex present XI: bilateral shoulder shrug XII: midline tongue extension Motor: Patient with generalized weakness.  Lifts both upper extremities above her head with 5-/5 strength bilaterally.  Unable to lift RLE off the bed.  Lifts the LLE a few inches off the bed at 4/5 strength.     Lab Results: Basic Metabolic Panel:  Recent Labs Lab 12/02/15 1322 12/03/15 0415  NA 138 138  K 4.0 4.2  CL 104 107  CO2 20* 24  GLUCOSE 76 87  BUN 21* 21*  CREATININE 0.79 0.78  CALCIUM 8.8* 8.5*    Liver Function Tests:  Recent Labs Lab 12/02/15 1322  AST 25  ALT 12*  ALKPHOS 118   BILITOT 0.8  PROT 7.5  ALBUMIN 3.8   No results for input(s): LIPASE, AMYLASE in the last 168 hours. No results for input(s): AMMONIA in the last 168 hours.  CBC:  Recent Labs Lab 12/02/15 1322 12/03/15 0415  WBC 8.3 8.4  NEUTROABS 5.9  --   HGB 15.4 13.8  HCT 45.9 41.9  MCV 89.8 90.3  PLT 331 317    Cardiac Enzymes:  Recent Labs Lab 12/02/15 1322  TROPONINI <0.03    Lipid Panel:  Recent Labs Lab 12/03/15 0415  CHOL 144  TRIG 69  HDL 53  CHOLHDL 2.7  VLDL 14  LDLCALC 77    CBG:  Recent Labs Lab 12/02/15 1823 12/03/15 0837  GLUCAP 83 97    Microbiology: Results for orders placed or performed during the hospital encounter of 12/02/15  MRSA PCR Screening     Status: None   Collection Time: 12/02/15  6:33 PM  Result Value Ref Range Status   MRSA by PCR NEGATIVE NEGATIVE Final    Comment:        The GeneXpert MRSA Assay (FDA approved for NASAL specimens only), is one component of a comprehensive MRSA colonization surveillance program. It is not intended to diagnose MRSA infection nor to guide or monitor treatment for MRSA infections.     Coagulation Studies:  Recent Labs  12/02/15 1322  LABPROT 13.6  INR 1.04    Imaging: Ct Head Wo Contrast  Result Date: 12/02/2015 CLINICAL DATA:  82 year old female left facial droop and weakness since 1800 hours yesterday. Initial encounter. EXAM: CT HEAD WITHOUT CONTRAST TECHNIQUE: Contiguous axial images were obtained from the base of the skull through the vertex without intravenous contrast. COMPARISON:  CT head without contrast 08/01/2015 and earlier. FINDINGS: Study is intermittently degraded by motion artifact despite repeated imaging attempts. Brain: No acute intracranial hemorrhage identified. No midline shift, mass effect, or evidence of intracranial mass lesion. Patchy and confluent cerebral white matter hypodensity. Heterogeneous hypodensity involving the bilateral deep gray matter nuclei. No  interval changes are evident. No cortically based acute infarct identified. Vascular: Calcified atherosclerosis at the skull base. No suspicious intracranial vascular hyperdensity. Skull: No acute osseous abnormality identified. Sinuses/Orbits: Visualized paranasal sinuses and mastoids are stable and well pneumatized. Other: No acute orbit or scalp soft tissue finding. IMPRESSION: Stable non contrast CT appearance of the brain since July with fairly advanced chronic small vessel disease. Electronically Signed   By: Odessa FlemingH  Hall M.D.   On: 12/02/2015 13:58    Medications:  I have reviewed the patient's current medications. Scheduled: . amLODipine  10 mg Oral BH-q7a  . aspirin  81 mg Oral Daily  . docusate sodium  100 mg Oral BID  . enalapril  40 mg Oral QPM  . heparin  5,000 Units Subcutaneous Q8H  . LORazepam  0.5 mg Intravenous Once  . metoprolol  75 mg Oral BID  . sertraline  50 mg Oral QHS  . venlafaxine  100 mg Oral Daily    Assessment/Plan: Patient stable.  After speaking with daughter at length will be conservative in her work up since very little in the way of intervention will be appropriate.    Recommendations: 1.  Continue ASA   LOS: 0 days   Thana FarrLeslie Jasmynn Pfalzgraf, MD Neurology [phone] 12/03/2015  12:06 PM

## 2015-12-03 NOTE — Progress Notes (Signed)
Initial Nutrition Assessment  DOCUMENTATION CODES:   Not applicable  INTERVENTION:  -Await SLP recommendations regarding po diet and further poc.   NUTRITION DIAGNOSIS:   Inadequate oral intake related to acute illness as evidenced by NPO status.    GOAL:   Patient will meet greater than or equal to 90% of their needs    MONITOR:   Diet advancement  REASON FOR ASSESSMENT:   Malnutrition Screening Tool    ASSESSMENT:      82 y.o female admitted with AMS, weakness, possible stroke.  History of HTN, stroke, hypokalemia, osteoporosis  SLP to evaluate not appropriate at present  No family present during visit this am  Noted 4% wt loss in the last 4 months per wt encounters.   Medications reviewed: colace Labs reviewed: BUN 21, creatinine WDL   Unable to complete Nutrition-Focused physical exam at this time.    Diet Order:  Diet NPO time specified  Skin:  Reviewed, no issues  Last BM:  PTA  Height:   Ht Readings from Last 1 Encounters:  12/03/15 5\' 3"  (1.6 m)    Weight:   Wt Readings from Last 1 Encounters:  12/03/15 91 lb 12.8 oz (41.6 kg)    Ideal Body Weight:     BMI:  Body mass index is 16.26 kg/m.  Estimated Nutritional Needs:   Kcal:  1300-1400 kcals/d  Protein:  50-63 g/d  Fluid:  >/=137400ml/d  EDUCATION NEEDS:   No education needs identified at this time  Letrell Attwood B. Freida BusmanAllen, RD, LDN [phone] (pager) Weekend/On-Call pager [phone])

## 2015-12-03 NOTE — Plan of Care (Signed)
Problem: SLP Dysphagia Goals Goal: Misc Dysphagia Goal Pt will safely tolerate po diet of least restrictive consistency w/ no overt s/s of aspiration noted by Staff/pt/family x3 sessions.    

## 2015-12-03 NOTE — Progress Notes (Signed)
   [facility] - [place] [facility]   Advance care planning  Hospital Day: 0 days Cheryl Moreno is a 82 y.o. female presenting with Altered Mental Status and Weakness .   Advance care planning discussed with patient  with additional Family at bedside - daughter Cheryl Moreno. All questions in regards to overall condition and expected prognosis answered. The decision was made to change current code status  CODE STATUS: limited, No intubation Time spent: 18 minutes

## 2015-12-03 NOTE — Evaluation (Signed)
Clinical/Bedside Swallow Evaluation Patient Details  Name: Cheryl Moreno MRN: [id number] Date of Birth: 09-07-1922  Today's Date: 12/03/2015 Time: SLP Start Time (ACUTE ONLY): 1400 SLP Stop Time (ACUTE ONLY): 1500 SLP Time Calculation (min) (ACUTE ONLY): 60 min  Past Medical History:  Past Medical History:  Diagnosis Date  . Hypertension   . Hypokalemia   . Osteoporosis   . Stroke [facility](HCC)    Past Surgical History:  Past Surgical History:  Procedure Laterality Date  . BLADDER SUSPENSION    . HIP SURGERY     HPI:  Pt is a 82 y.o. female with a known history of previous strokes, essential hypertension and Cognitive decline per Daughter(unsure of degree) who comes from facility because of confusion, right facial droop. Patient has history of stroke, nonambulatory, noted to have right facial droop, confusion, patient's family refused transport yesterday as per the chart, because of continued symptoms she was brought in here today. Also noted to have some garbled speech for the first few months but got worse recently. Daughter indicated she did not note the facial droop anymore. Pt presented w/ decreased verbal output; she did not attempt any verbal communication w/ SLP or answer any y/n questions. She did not follow any commands. Unsure if related to the mild Cognitive decline as per report.    Assessment / Plan / Recommendation Clinical Impression  Unable to make a thorough assessment of pt's swallow function at this time d/t pt's refusal of the po trials. Daughter was present and was able to encourage pt to take jsut a few tsp trials of min amounts b/f she flatly refused w/ further agitation noted. Pt remained nonverbal and put hand to mouth/face the majority of trials. W/ the min amount taken, pt licked at lips, smacked, then a swallow was appreciated x3 of the trials. Pt did not phonate or verbalize so was unable to assess vocal quality, however, no decline in resipratory status was noted. MD  was consulted and agreed w/ initiation of trial diet of Dysphagia 1 w/ thin liquids w/ strict aspiration precautions and to stop if any overt s/s of aspiration are noted by Staff and family/pt. ST services will f/u w/ ongoing assessment of toleration of diet; education on precautions. Daughter agreed.    Aspiration Risk  Mild aspiration risk    Diet Recommendation  Dysphagia 1 w/ thin liquids; strict aspiration precautions and full feeding assistance w/ close monitoring - stop if any overt s/s of aspiration noted.   Medication Administration: Via alternative means    Other  Recommendations Recommended Consults:  (Dietician) Oral Care Recommendations: Oral care BID;Staff/trained caregiver to provide oral care   Follow up Recommendations  (TBD)      Frequency and Duration min 3x week  2 weeks       Prognosis Prognosis for Safe Diet Advancement: Guarded Barriers to Reach Goals: Cognitive deficits;Severity of deficits;Behavior      Swallow Study   General Date of Onset: 12/02/15 HPI: Pt is a 82 y.o. female with a known history of previous strokes, essential hypertension and Cognitive decline per Daughter(unsure of degree) who comes from facility because of confusion, right facial droop. Patient has history of stroke, nonambulatory, noted to have right facial droop, confusion, patient's family refused transport yesterday as per the chart, because of continued symptoms she was brought in here today. Also noted to have some garbled speech for the first few months but got worse recently. Daughter indicated she did not note the facial droop anymore.  Pt presented w/ decreased verbal output; she did not attempt any verbal communication w/ SLP or answer any y/n questions. She did not follow any commands. Unsure if related to the mild Cognitive decline as per report.  Type of Study: Bedside Swallow Evaluation Previous Swallow Assessment: none indicated Diet Prior to this Study: Dysphagia 3  (soft);Thin liquids (soft foods) Temperature Spikes Noted: No (wbc not elevated) Respiratory Status: Room air History of Recent Intubation: No Behavior/Cognition: Alert;Confused;Agitated;Distractible;Requires cueing;Doesn't follow directions Oral Cavity Assessment:  (unable to assess d/t Cognitive status) Oral Care Completed by SLP: No (d/t Cognitive status) Oral Cavity - Dentition: Adequate natural dentition Vision:  (n/a) Self-Feeding Abilities: Total assist Patient Positioning: Upright in bed Baseline Vocal Quality:  (nonverbal - Cognitive decline) Volitional Cough: Cognitively unable to elicit Volitional Swallow: Unable to elicit    Oral/Motor/Sensory Function Overall Oral Motor/Sensory Function:  (unable to fully assess d/t Cognitive decline; appeared wfl)   Ice Chips Ice chips: Not tested Other Comments: refused   Thin Liquid Thin Liquid: Not tested Other Comments: refused    Nectar Thick Nectar Thick Liquid: Impaired Presentation: Spoon (x2 - min amount; refused) Pharyngeal Phase Impairments:  (none)   Honey Thick Honey Thick Liquid: Not tested   Puree Puree: Impaired Presentation: Spoon (2 trials - min amount; refused) Pharyngeal Phase Impairments:  (none)   Solid   GO   Solid: Not tested    Functional Assessment Tool Used: clinical judgement Functional Limitations: Swallowing Swallow Current Status ([id number]): At least 80 percent but less than 100 percent impaired, limited or restricted Swallow Goal Status [phone]): At least 40 percent but less than 60 percent impaired, limited or restricted Swallow Discharge Status [phone]): At least 20 percent but less than 40 percent impaired, limited or restricted     Cheryl SomKatherine Bob Eastwood, MS, CCC-SLP  Cheryl Moreno 12/03/2015,3:14 PM

## 2015-12-03 NOTE — Progress Notes (Signed)
While rounding, CH made initial visit to room 118. Pt was awake but confused. Daughter was bedside. Our conversation wasshort as the MD came by to evaluate. CH is available for follow up as needed.    12/03/15 1300  Clinical Encounter Type  Visited With Patient;Patient and family together  Visit Type Initial;Spiritual support  Referral From Nurse

## 2015-12-03 NOTE — Care Management Obs Status (Signed)
MEDICARE OBSERVATION STATUS NOTIFICATION   Patient Details  Name: Alanson Pulsdith Edgett MRN: [id number] Date of Birth: 1922/05/31   Medicare Observation Status Notification Given:  Yes POA Wannetta SenderBrenda Morris contacted at [phone], and given information. Form signed by CM.    Berna Bueheryl Kin Galbraith, RN 12/03/2015, 10:01 AM

## 2015-12-03 NOTE — Progress Notes (Signed)
[facility] [place] at [facility]   PATIENT NAME: Cheryl Moreno    MRN#:  [id number]  DATE OF BIRTH:  1922-04-28  SUBJECTIVE:  Hospital Day: 0 days Cheryl Moreno is a 82 y.o. female presenting with Altered Mental Status and Weakness .   Overnight events: No acute overnight events Interval Events: No complaints patient unable to provide information given mental status which is at baseline, case discussed with patient's daughter present at bedside  REVIEW OF SYSTEMS:  Unable to obtain given patient's mental status  DRUG ALLERGIES:   Allergies  Allergen Reactions  . Sulfa Antibiotics Other (See Comments)    Reaction:  Unknown     VITALS:  Blood pressure (!) 169/60, pulse 75, temperature 98.1 F (36.7 C), temperature source Oral, resp. rate 16, height 5\' 3"  (1.6 m), weight 41.6 kg (91 lb 12.8 oz), SpO2 96 %.  PHYSICAL EXAMINATION:  VITAL SIGNS: Vitals:   12/03/15 0732 12/03/15 1019  BP: (!) 155/77 (!) 169/60  Pulse: 78 75  Resp: 16 16  Temp:     GENERAL:82 y.o.female currently in no acute distress.  HEAD: Normocephalic, atraumatic.  EYES: Pupils equal, round, reactive to light. Extraocular muscles intact. No scleral icterus.  MOUTH: Moist mucosal membrane. Dentition intact. No abscess noted.  EAR, NOSE, THROAT: Clear without exudates. No external lesions.  NECK: Supple. No thyromegaly. No nodules. No JVD.  PULMONARY: Clear to ascultation, without wheeze rails or rhonci. No use of accessory muscles, Good respiratory effort. good air entry bilaterally CHEST: Nontender to palpation.  CARDIOVASCULAR: S1 and S2. Regular rate and rhythm. No murmurs, rubs, or gallops. No edema. Pedal pulses 2+ bilaterally.  GASTROINTESTINAL: Soft, nontender, nondistended. No masses. Positive bowel sounds. No hepatosplenomegaly.  MUSCULOSKELETAL: No swelling, clubbing, or edema. Range of motion full in all extremities.  NEUROLOGIC: No gross focal neurological deficits.  Unable to accurately assess given patient's mental status SKIN: No ulceration, lesions, rashes, or cyanosis. Skin warm and dry. Turgor intact.  PSYCHIATRIC: Mood, affect combative. The patient is awake, oriented 0. Insight, judgment poor.      LABORATORY PANEL:   CBC  Recent Labs Lab 12/03/15 0415  WBC 8.4  HGB 13.8  HCT 41.9  PLT 317   ------------------------------------------------------------------------------------------------------------------  Chemistries   Recent Labs Lab 12/02/15 1322 12/03/15 0415  NA 138 138  K 4.0 4.2  CL 104 107  CO2 20* 24  GLUCOSE 76 87  BUN 21* 21*  CREATININE 0.79 0.78  CALCIUM 8.8* 8.5*  AST 25  --   ALT 12*  --   ALKPHOS 118  --   BILITOT 0.8  --    ------------------------------------------------------------------------------------------------------------------  Cardiac Enzymes  Recent Labs Lab 12/02/15 1322  TROPONINI <0.03   ------------------------------------------------------------------------------------------------------------------  RADIOLOGY:  Ct Head Wo Contrast  Result Date: 12/02/2015 CLINICAL DATA:  82 year old female left facial droop and weakness since 1800 hours yesterday. Initial encounter. EXAM: CT HEAD WITHOUT CONTRAST TECHNIQUE: Contiguous axial images were obtained from the base of the skull through the vertex without intravenous contrast. COMPARISON:  CT head without contrast 08/01/2015 and earlier. FINDINGS: Study is intermittently degraded by motion artifact despite repeated imaging attempts. Brain: No acute intracranial hemorrhage identified. No midline shift, mass effect, or evidence of intracranial mass lesion. Patchy and confluent cerebral white matter hypodensity. Heterogeneous hypodensity involving the bilateral deep gray matter nuclei. No interval changes are evident. No cortically based acute infarct identified. Vascular: Calcified atherosclerosis at the skull base. No suspicious intracranial  vascular hyperdensity. Skull:  No acute osseous abnormality identified. Sinuses/Orbits: Visualized paranasal sinuses and mastoids are stable and well pneumatized. Other: No acute orbit or scalp soft tissue finding. IMPRESSION: Stable non contrast CT appearance of the brain since July with fairly advanced chronic small vessel disease. Electronically Signed   By: Odessa FlemingH  Hall M.D.   On: 12/02/2015 13:58    EKG:   Orders placed or performed during the hospital encounter of 12/02/15  . ED EKG  . ED EKG  . EKG 12-Lead  . EKG 12-Lead    ASSESSMENT AND PLAN:   Cheryl Moreno is a 82 y.o. female presenting with Altered Mental Status and Weakness . Admitted 12/02/2015 : Day #: 0 days 1. TIA: Original symptoms of facial droop had resolved MRI still pending we'll premedicate prior continue with aspirin 2. Essential hypertension: Restart medications and 3. Vascular dementia: Continue aspirin current medications, speech evaluation discussed options if patient unable to tolerate diet may recommend palliative care  Patient's baseline assisted living however gets full assistance with all activities of daily living aside from eating   All the records are reviewed and case discussed with Care Management/Social Workerr. Management plans discussed with the patient, family and they are in agreement.  CODE STATUS: limited, No intubation TOTAL TIME TAKING CARE OF THIS PATIENT: 33 minutes.   POSSIBLE D/C IN 1-2DAYS, DEPENDING ON CLINICAL CONDITION.   Lerline Valdivia,  Mardi MainlandDavid K M.D on 12/03/2015 at 11:21 AM  Between 7am to 6pm - Pager - [phone]  After 6pm: House Pager: - [phone]  [facility]  Office  [phone]  CC: Primary care physician; Pcp Not In System

## 2015-12-03 NOTE — Progress Notes (Signed)
PT Cancellation Note  Patient Details Name: Cheryl Moreno MRN: [id number] DOB: [birth year]   Cancelled Treatment:    Reason Eval/Treat Not Completed: Patient's level of consciousness Pt is unable to say even a single word.  She has distant, agitated look and her only responses to PT's different requests to work with her were met with pulling sheets over head and/or slapping at PT.  Pt does not appear appropriate for PT at this time, nursing to call if her mental status changes or she seems willing to participate.    Kreg Shropshire 12/03/2015, 9:11 AM

## 2015-12-03 NOTE — Progress Notes (Signed)
Patient's daughter/POA updated patient was unable to complete tests for stroke workup. Diet placed per speech eval. Daughter acknowledged understanding of current plan of care.

## 2015-12-03 NOTE — Clinical Social Work Note (Signed)
Clinical Social Work Assessment  Patient Details  Name: Cheryl Moreno MRN: [id number] Date of Birth: December 22, 1922  Date of referral:  12/03/15               Reason for consult:  Discharge Planning (admitted from ALF [facility])                Permission sought to share information with:  Case Production designer, theatre/television/film, Magazine features editor, Family Supports Permission granted to share information::  Yes, Verbal Permission Granted  Name::        Agency::  [facility]  Relationship::  Daughter Steward Drone (HCPOA)  Contact Information:     Housing/Transportation Living arrangements for the past 2 months:  Assisted Dealer of Information:  Medical Team, Case Production designer, theatre/television/film, Power of [place], Adult Children Patient Interpreter Needed:  None Criminal Activity/Legal Involvement Pertinent to Current Situation/Hospitalization:  No - Comment as needed Significant Relationships:  Adult Children, Other Family Members, Community Support Lives with:  Facility Resident Do you feel safe going back to the place where you live?  Yes Need for family participation in patient care:  Yes (Comment)  Care giving concerns:  LCSW met with daughter regarding knowledge of patient's current baseline.  Daughter reports 4 years ago patient had a Stroke, [facility] and [facility].  At the time patient was living in [place], relocated to [place] to be close to daughter. She was placed in [facility], but fell and broke her hip.  She refused therapy at [facility] and became wheel chair bound.  She has been wheel chair bound for the last three years. She does participate socially with other residents at ALF and was able to feed herself. She had assistance with dressing and bathing at the facility and taking her medications. Daughter reports this is not her baseline as she is nonverbal/garbbled speech and does not think she has eaten since last Tuesday.  If at all possible, daughter is hopeful for  return to the [place] as this is patient's most natural environment and feels she will not do well at facility or participate in any type of physical therapy.   Social Worker assessment / plan:  LCSW explained role and consult for daughter. She was open and appreciative. Discussed level of care and ideas of what daughter is expecting. Daughter hopeful for patient to return to the [facility].  Discussed that LCSW will have to see medical work up and what patient will need at time of DC to make sure [facility] is able to accommodate. Discussed with MD plan of care.  Will complete work up and possibly get GOC meeting and speech to understand if patient can swallow. LCSW following acutely and will update FL2 once more is known for disposition and level of care.  Employment status:  Retired Database administrator PT Recommendations:  Not assessed at this time Information / Referral to community resources:     Patient/Family's Response to care:  Agreeable to plan  Patient/Family's Understanding of and Emotional Response to Diagnosis, Current Treatment, and Prognosis:  Daughter very eager to learn about patient's current diagnosis and prognosis. She is realistic and understanding of patient's deficits and change in baseline.  Emotional Assessment Appearance:  Appears stated age Attitude/Demeanor/Rapport:  Unable to Assess, Unresponsive Affect (typically observed):  Quiet, Calm Orientation:  Oriented to Self Alcohol / Substance use:  Not Applicable Psych involvement (Current and /or in the community):  No (Comment)  Discharge Needs  Concerns to be addressed:  No discharge needs identified Readmission within the last 30 days:  No Current discharge risk:  None Barriers to Discharge:  Continued Medical Work up (possible barrier: return to ALF)   Lilly [place], LCSW 12/03/2015, 11:03 AM

## 2015-12-03 NOTE — Progress Notes (Signed)
Attempted bath x2. Patient got agitated and combative. Deodorant applied and wiped skin with hygiene wipes.

## 2015-12-03 NOTE — Progress Notes (Signed)
MRI notified patient will need to be pre-medicated before test. MRI to call back when they are ready to take patient.

## 2015-12-03 NOTE — Progress Notes (Signed)
Dr. Clint GuyHower notified patient's daughter/POA Steward DroneBrenda at bedside requesting to speak with MD and stated she would. Dr. Clint GuyHower to come down shortly to speak with Steward DroneBrenda.   Dr. Clint GuyHower also notified patient will need medication to help patient relax in order for MRI testing to be successful. MD to place appropriate order.

## 2015-12-04 LAB — CBC
HCT: 42.9 % (ref 35.0–47.0)
HEMOGLOBIN: 14.1 g/dL (ref 12.0–16.0)
MCH: 29.7 pg (ref 26.0–34.0)
MCHC: 32.8 g/dL (ref 32.0–36.0)
MCV: 90.6 fL (ref 80.0–100.0)
PLATELETS: 324 10*3/uL (ref 150–440)
RBC: 4.73 MIL/uL (ref 3.80–5.20)
RDW: 13.6 % (ref 11.5–14.5)
WBC: 7.9 10*3/uL (ref 3.6–11.0)

## 2015-12-04 LAB — GLUCOSE, CAPILLARY: GLUCOSE-CAPILLARY: 75 mg/dL (ref 65–99)

## 2015-12-04 MED ORDER — MIRTAZAPINE 7.5 MG PO TABS: 7.5000 mg | ORAL_TABLET | Freq: Every day | ORAL | 0 refills | Status: AC

## 2015-12-04 NOTE — [place] FL2 (Signed)
[place] MEDICAID FL2 LEVEL OF CARE SCREENING TOOL     IDENTIFICATION  Patient Name: Cheryl Moreno Birthdate: [birth year] Sex: female Admission Date (Current Location): 12/02/2015  [place]   Facility and Address:  [facility], [address], [place], [place] [address]      Provider Number: [id number]  Attending Physician Name and Address:  Wyatt Hasteavid K Makeila Yamaguchi, MD  Relative Name and Phone Number:       Current Level of Care: Hospital Recommended Level of Care: Assisted Living Facility Prior Approval Number:    Date Approved/Denied:   PASRR Number:    Discharge Plan: Domiciliary (Rest home)    Current Diagnoses: Patient Active Problem List   Diagnosis Date Noted  . TIA (transient ischemic attack) 12/02/2015  . Pain in limb 11/13/2012  . Dermatophytosis of nail 11/13/2012    Orientation RESPIRATION BLADDER Height & Weight      (Patient has dementia.)  Normal Incontinent Weight: 88 lb 14.4 oz (40.3 kg) Height:  5\' 3"  (160 cm)  BEHAVIORAL SYMPTOMS/MOOD NEUROLOGICAL BOWEL NUTRITION STATUS      Incontinent  (Patient refused diet or SLP assessment)  AMBULATORY STATUS COMMUNICATION OF NEEDS Skin   Total Care Non-Verbally Normal                       Personal Care Assistance Level of Assistance  Bathing, Dressing Bathing Assistance: Maximum assistance   Dressing Assistance: Maximum assistance     Functional Limitations Info  Speech     Speech Info: Impaired    SPECIAL CARE FACTORS FREQUENCY   Diet: Puree foods, thin liquids Palliative Care to follow at facility.                    Contractures Contractures Info: Present    Additional Factors Info  Code Status, Allergies Code Status Info: Partial: refuses intubation per AD Allergies Info: Sulfa Antibiotics           Current Medications (12/04/2015):  This is the current hospital active medication list Current Facility-Administered  Medications  Medication Dose Route Frequency Provider Last Rate Last Dose  . acetaminophen (TYLENOL) tablet 650 mg  650 mg Oral Q6H PRN Katha HammingSnehalatha Konidena, MD       Or  . acetaminophen (TYLENOL) suppository 650 mg  650 mg Rectal Q6H PRN Katha HammingSnehalatha Konidena, MD      . amLODipine (NORVASC) tablet 10 mg  10 mg Oral BH-q7a Wyatt Hasteavid K Reyann Troop, MD      . aspirin chewable tablet 324 mg  324 mg Oral Daily Thana FarrLeslie Reynolds, MD      . bisacodyl (DULCOLAX) EC tablet 5 mg  5 mg Oral Daily PRN Katha HammingSnehalatha Konidena, MD      . docusate sodium (COLACE) capsule 100 mg  100 mg Oral BID Katha HammingSnehalatha Konidena, MD      . enalapril (VASOTEC) tablet 40 mg  40 mg Oral QPM Wyatt Hasteavid K Estephanie Hubbs, MD      . heparin injection 5,000 Units  5,000 Units Subcutaneous Q8H Katha HammingSnehalatha Konidena, MD   5,000 Units at 12/04/15 0446  . hydrALAZINE (APRESOLINE) injection 10 mg  10 mg Intravenous Q6H PRN Milagros LollSrikar Sudini, MD   10 mg at 12/02/15 1930  . HYDROcodone-acetaminophen (NORCO/VICODIN) 5-325 MG per tablet 1-2 tablet  1-2 tablet Oral Q4H PRN Katha HammingSnehalatha Konidena, MD      . labetalol (NORMODYNE,TRANDATE) injection 10 mg  10 mg Intravenous Q6H PRN Katha HammingSnehalatha Konidena, MD  10 mg at 12/02/15 1718  . metoprolol tartrate (LOPRESSOR) tablet 75 mg  75 mg Oral BID Wyatt Hasteavid K Azlin Zilberman, MD      . ondansetron [facility](ZOFRAN) tablet 4 mg  4 mg Oral Q6H PRN Katha HammingSnehalatha Konidena, MD       Or  . ondansetron (ZOFRAN) injection 4 mg  4 mg Intravenous Q6H PRN Katha HammingSnehalatha Konidena, MD      . sertraline (ZOLOFT) tablet 50 mg  50 mg Oral QHS Wyatt Hasteavid K Keyarra Rendall, MD      . traZODone (DESYREL) tablet 25 mg  25 mg Oral QHS PRN Katha HammingSnehalatha Konidena, MD      . venlafaxine (EFFEXOR) tablet 100 mg  100 mg Oral Daily Wyatt Hasteavid K Maie Kesinger, MD         Discharge Medications: DISCHARGE MEDICATIONS:      Current Discharge Medication List       START taking these medications   Details  mirtazapine (REMERON) 7.5 MG tablet Take 1 tablet (7.5 mg total) by mouth at bedtime. Qty: 30 tablet, Refills: 0          CONTINUE these medications which have NOT CHANGED   Details  acetaminophen (TYLENOL) 325 MG tablet Take 650 mg by mouth 3 (three) times daily as needed for mild pain.     amLODipine (NORVASC) 10 MG tablet Take 10 mg by mouth every morning. Hold for SBP < 100.    aspirin 81 MG chewable tablet Chew 81 mg by mouth daily.    Docusate Sodium (STOOL SOFTENER) 100 MG capsule Take 200 mg by mouth daily as needed for constipation.    Melatonin 3 MG TABS Take 1 tablet by mouth at bedtime as needed.    metoprolol (LOPRESSOR) 50 MG tablet Take 75 mg by mouth 2 (two) times daily.     ondansetron (ZOFRAN) 4 MG tablet Take 4 mg by mouth every 8 (eight) hours as needed for nausea or vomiting.    sertraline (ZOLOFT) 50 MG tablet Take 50 mg by mouth at bedtime.    enalapril (VASOTEC) 20 MG tablet Take 40 mg by mouth every evening.     ENSURE PLUS (ENSURE PLUS) LIQD Take 1 Can by mouth daily.    Multiple Vitamins-Minerals (ICAPS) TABS Take 1 tablet by mouth 2 (two) times daily with a meal.    venlafaxine (EFFEXOR) 100 MG tablet Take 100 mg by mouth daily.         DISCHARGE INSTRUCTIONS:  Palliative care follow at facility  DIET:  Puree foods, thin liquids  DISCHARGE CONDITION:  Stable   Relevant Imaging Results:  Relevant Lab Results:   Additional Information    Judi CongKaren M White, LCSW

## 2015-12-04 NOTE — Progress Notes (Signed)
[facility] with pt [facility]. Dgt here and agreeable. Pt ate few bites of lunch/drank approximately 1 cup. Pt very resistive/combative to touch at intervals. Alert; resting quietly, remains confused which is her baseline. [place] called for transport. Ready for discharge.

## 2015-12-04 NOTE — Progress Notes (Signed)
CNA reported elevated BP. Asymptomatic. Pt did not receive amodlipine thiis a.m. Attempted to given med with pt gagging and vomited undigested lunch. Notified Dr. Clint GuyHower and advised no new orders; continue with discharge to facility.

## 2015-12-04 NOTE — Evaluation (Signed)
Physical Therapy Evaluation Patient Details Name: Alanson Pulsdith Broccoli MRN: [id number] DOB: 07-Jul-1922 Today's Date: 12/04/2015   History of Present Illness  82 yo resident of ALF admitted with R side weakness was noted by daughter to be a dependent transfer but sitting control has declined.  PMHx:  HTN, osteoporosis, hypokalemia, stroke.  Currently diagnosed with TIA   Clinical Impression  Pt is admitted with some increased weakness on R side but also has declined her control of sitting to be able to sit safely in a wheelchair in her home environment.  Plan is to transition back to ALF and have her therapist there continue and will keep her acutely if she is going to stay on longer than today.  Will restart strengthening of trunk to increase sitting safety and to protect her sacral area from shearing of skin.    Follow Up Recommendations Home health PT;Supervision/Assistance - 24 hour    Equipment Recommendations  None recommended by PT    Recommendations for Other Services       Precautions / Restrictions Precautions Precautions: Fall (telemetry) Restrictions Weight Bearing Restrictions: No      Mobility  Bed Mobility Overal bed mobility: Needs Assistance Bed Mobility: Supine to Sit;Sit to Supine     Supine to sit: Max assist Sit to supine: Max assist   General bed mobility comments: pt cannot assist but engages her abd mm's with the effort  Transfers Overall transfer level: Needs assistance               General transfer comment: pt was not able to assist standing  Ambulation/Gait             General Gait Details: non ambulatory baseline  Research scientist (physical sciences)tairs            Wheelchair Mobility Wheelchair Mobility Wheelchair mobility:  (not strong enough to assess)  Modified Rankin (Stroke Patients Only) Modified Rankin (Stroke Patients Only) Pre-Morbid Rankin Score: Moderately severe disability Modified Rankin: Severe disability     Balance Overall balance  assessment: Needs assistance Sitting-balance support: Feet supported Sitting balance-Leahy Scale: Poor   Postural control: Posterior lean;Left lateral lean Standing balance support:  (unable)                                 Pertinent Vitals/Pain Pain Assessment: Faces Pain Score: 2  Faces Pain Scale: Hurts a little bit Pain Location: L hip Pain Intervention(s): Limited activity within patient's tolerance;Monitored during session;Repositioned    Home Living Family/patient expects to be discharged to:: Assisted living               Home Equipment: Wheelchair - manual (uses hoyer lift)      Prior Function Level of Independence: Needs assistance   Gait / Transfers Assistance Needed: hoyer transfers but worsening balance now  ADL's / Homemaking Assistance Needed: total care from nursing        Hand Dominance        Extremity/Trunk Assessment   Upper Extremity Assessment: Generalized weakness           Lower Extremity Assessment: Generalized weakness      Cervical / Trunk Assessment: Kyphotic  Communication   Communication: Expressive difficulties;Other (comment) (non verbal)  Cognition Arousal/Alertness: Awake/alert Behavior During Therapy: Flat affect Overall Cognitive Status: History of cognitive impairments - at baseline       Memory: Decreased recall of precautions;Decreased short-term memory  General Comments      Exercises     Assessment/Plan    PT Assessment Patient needs continued PT services  PT Problem List Decreased activity tolerance;Decreased range of motion;Decreased strength;Decreased balance;Decreased mobility;Decreased coordination;Decreased cognition;Decreased knowledge of use of DME;Decreased safety awareness;Decreased knowledge of precautions;Cardiopulmonary status limiting activity;Decreased skin integrity          PT Treatment Interventions Functional mobility training;Therapeutic  activities;Therapeutic exercise;Balance training;Neuromuscular re-education;Patient/family education    PT Goals (Current goals can be found in the Care Plan section)  Acute Rehab PT Goals Patient Stated Goal: none stated PT Goal Formulation: With family Time For Goal Achievement: 12/18/15 Potential to Achieve Goals: Good    Frequency Min 2X/week   Barriers to discharge   can transition to therapy at ALF    Co-evaluation               End of Session   Activity Tolerance: Patient limited by lethargy;Patient limited by fatigue;Other (comment) (weakness generally) Patient left: in bed;with call bell/phone within reach;with family/visitor present;with bed alarm set Nurse Communication: Mobility status    Functional Assessment Tool Used: clinical judgment Functional Limitation: Changing and maintaining body position Changing and Maintaining Body Position Current Status (Z6109(G8981): At least 80 percent but less than 100 percent impaired, limited or restricted Changing and Maintaining Body Position Goal Status (U0454(G8982): At least 40 percent but less than 60 percent impaired, limited or restricted    Time: [id number] PT Time Calculation (min) (ACUTE ONLY): 24 min   Charges:   PT Evaluation $PT Eval Moderate Complexity: 1 Procedure PT Treatments $Therapeutic Activity: 8-22 mins   PT G Codes:   PT G-Codes **NOT FOR INPATIENT CLASS** Functional Assessment Tool Used: clinical judgment Functional Limitation: Changing and maintaining body position Changing and Maintaining Body Position Current Status (N8295(G8981): At least 80 percent but less than 100 percent impaired, limited or restricted Changing and Maintaining Body Position Goal Status (A2130(G8982): At least 40 percent but less than 60 percent impaired, limited or restricted    Ivar DrapeStout, Jennifer Holland E 12/04/2015, 11:47 AM    Samul Dadauth Golda Zavalza, PT MS Acute Rehab Dept. Number: [facility] [id number] and [facility] [phone]

## 2015-12-04 NOTE — Progress Notes (Signed)
TC to 911 for ETA of EMS for pt transport. Communicator advised crew chief canceled transport around 3:30 p.m. Advised her that pt is discharged, we have been waiting for transport, no communication from me/MD to cancel transport. Advised transport is still needed.

## 2015-12-04 NOTE — Progress Notes (Signed)
SLP visit: SLP attempted to give trials, pt refused trials, agitated, continue with current plan as pt allows.

## 2015-12-04 NOTE — Progress Notes (Signed)
Puts hand up to block mouth, pushes feeder's hand away and said. "no,no,no."  Gave enapril crushed in 2 small bites of applesauce. Still awaiting transport by EMS.

## 2015-12-04 NOTE — Discharge Summary (Signed)
[employer] [facility] [place] [facility]   PATIENT NAME: Cheryl Moreno    MR#:  [id number]  DATE OF BIRTH:  1922-12-16  DATE OF ADMISSION:  12/02/2015 ADMITTING PHYSICIAN: Katha Hamming, MD  DATE OF DISCHARGE: 12/04/15  PRIMARY CARE PHYSICIAN: Pcp Not In System    ADMISSION DIAGNOSIS:  Altered mental status [R41.82] Cerebrovascular accident (CVA), unspecified mechanism (HCC) [I63.9]  DISCHARGE DIAGNOSIS:  Active Problems:   TIA (transient ischemic attack) dementia  SECONDARY DIAGNOSIS:   Past Medical History:  Diagnosis Date  . Hypertension   . Hypokalemia   . Osteoporosis   . Stroke [facility])     HOSPITAL COURSE:  Cheryl Moreno  is a 82 y.o. female admitted 12/02/2015 with chief complaint Altered Mental Status and Weakness . Please see H&P performed by Katha Hamming, MD for further information. Patient presented with the above symptoms in a background of dementia. Her neurological findings (right facial droop) resolved. She was evaluated by neurology as well. Given baseline mental status she was unable to undergo MRI. However, speech evaluated and deemed her safe for puree food with aspiration precaution    Discussed with patients daughter, medically she is stable for discharge but given decrease appetite we can try Remeron. Will attempt to have palliative care follow at facility  DISCHARGE CONDITIONS:   stable  CONSULTS OBTAINED:  Treatment Team:  Kym Groom, MD  DRUG ALLERGIES:   Allergies  Allergen Reactions  . Sulfa Antibiotics Other (See Comments)    Reaction:  Unknown     DISCHARGE MEDICATIONS:   Current Discharge Medication List    START taking these medications   Details  mirtazapine (REMERON) 7.5 MG tablet Take 1 tablet (7.5 mg total) by mouth at bedtime. Qty: 30 tablet, Refills: 0      CONTINUE these medications which have NOT CHANGED   Details  acetaminophen (TYLENOL) 325 MG tablet Take 650 mg by mouth 3 (three)  times daily as needed for mild pain.     amLODipine (NORVASC) 10 MG tablet Take 10 mg by mouth every morning. Hold for SBP < 100.    aspirin 81 MG chewable tablet Chew 81 mg by mouth daily.    Docusate Sodium (STOOL SOFTENER) 100 MG capsule Take 200 mg by mouth daily as needed for constipation.    Melatonin 3 MG TABS Take 1 tablet by mouth at bedtime as needed.    metoprolol (LOPRESSOR) 50 MG tablet Take 75 mg by mouth 2 (two) times daily.     ondansetron (ZOFRAN) 4 MG tablet Take 4 mg by mouth every 8 (eight) hours as needed for nausea or vomiting.    sertraline (ZOLOFT) 50 MG tablet Take 50 mg by mouth at bedtime.    enalapril (VASOTEC) 20 MG tablet Take 40 mg by mouth every evening.     ENSURE PLUS (ENSURE PLUS) LIQD Take 1 Can by mouth daily.    Multiple Vitamins-Minerals (ICAPS) TABS Take 1 tablet by mouth 2 (two) times daily with a meal.    venlafaxine (EFFEXOR) 100 MG tablet Take 100 mg by mouth daily.         DISCHARGE INSTRUCTIONS:  Palliative care follow at facility  DIET:  Puree foods, thin liquids  DISCHARGE CONDITION:  Stable  ACTIVITY:  Activity as tolerated  OXYGEN:  Home Oxygen: No.   Oxygen Delivery: room air  DISCHARGE LOCATION:  nursing home   If you experience worsening of your admission symptoms, develop shortness of breath, life threatening emergency, suicidal  or homicidal thoughts you must seek medical attention immediately by calling 911 or calling your MD immediately  if symptoms less severe.  You Must read complete instructions/literature along with all the possible adverse reactions/side effects for all the Medicines you take and that have been prescribed to you. Take any new Medicines after you have completely understood and accpet all the possible adverse reactions/side effects.   Please note  You were cared for by a hospitalist during your hospital stay. If you have any questions about your discharge medications or the care you  received while you were in the hospital after you are discharged, you can call the unit and asked to speak with the hospitalist on call if the hospitalist that took care of you is not available. Once you are discharged, your primary care physician will handle any further medical issues. Please note that NO REFILLS for any discharge medications will be authorized once you are discharged, as it is imperative that you return to your primary care physician (or establish a relationship with a primary care physician if you do not have one) for your aftercare needs so that they can reassess your need for medications and monitor your lab values.    On the day of Discharge:   VITAL SIGNS:  Blood pressure 130/78, pulse 85, temperature 98.4 F (36.9 C), resp. rate 19, height 5\' 3"  (1.6 m), weight 40.3 kg (88 lb 14.4 oz), SpO2 95 %.  I/O:   Intake/Output Summary (Last 24 hours) at 12/04/15 0932 Last data filed at 12/04/15 0400  Gross per 24 hour  Intake                0 ml  Output                0 ml  Net                0 ml    PHYSICAL EXAMINATION:  GENERAL:  82 y.o.-year-old patient lying in the bed with no acute distress.  EYES: Pupils equal, round, reactive to light and accommodation. No scleral icterus. Extraocular muscles intact.  HEENT: Head atraumatic, normocephalic. Oropharynx and nasopharynx clear.  NECK:  Supple, no jugular venous distention. No thyroid enlargement, no tenderness.  LUNGS: Normal breath sounds bilaterally, no wheezing, rales,rhonchi or crepitation. No use of accessory muscles of respiration.  CARDIOVASCULAR: S1, S2 normal. No murmurs, rubs, or gallops.  ABDOMEN: Soft, non-tender, non-distended. Bowel sounds present. No organomegaly or mass.  EXTREMITIES: No pedal edema, cyanosis, or clubbing.  NEUROLOGIC: Cranial nerves II through XII are intact. Muscle strength 5/5 in all extremities. Sensation intact. Gait not checked.  PSYCHIATRIC: awake, confused  SKIN: No obvious  rash, lesion, or ulcer.   DATA REVIEW:   CBC  Recent Labs Lab 12/04/15 0518  WBC 7.9  HGB 14.1  HCT 42.9  PLT 324    Chemistries   Recent Labs Lab 12/02/15 1322 12/03/15 0415  NA 138 138  K 4.0 4.2  CL 104 107  CO2 20* 24  GLUCOSE 76 87  BUN 21* 21*  CREATININE 0.79 0.78  CALCIUM 8.8* 8.5*  AST 25  --   ALT 12*  --   ALKPHOS 118  --   BILITOT 0.8  --     Cardiac Enzymes  Recent Labs Lab 12/02/15 1322  TROPONINI <0.03    Microbiology Results  Results for orders placed or performed during the hospital encounter of 12/02/15  MRSA PCR Screening  Status: None   Collection Time: 12/02/15  6:33 PM  Result Value Ref Range Status   MRSA by PCR NEGATIVE NEGATIVE Final    Comment:        The GeneXpert MRSA Assay (FDA approved for NASAL specimens only), is one component of a comprehensive MRSA colonization surveillance program. It is not intended to diagnose MRSA infection nor to guide or monitor treatment for MRSA infections.     RADIOLOGY:  Ct Head Wo Contrast  Result Date: 12/02/2015 CLINICAL DATA:  82 year old female left facial droop and weakness since 1800 hours yesterday. Initial encounter. EXAM: CT HEAD WITHOUT CONTRAST TECHNIQUE: Contiguous axial images were obtained from the base of the skull through the vertex without intravenous contrast. COMPARISON:  CT head without contrast 08/01/2015 and earlier. FINDINGS: Study is intermittently degraded by motion artifact despite repeated imaging attempts. Brain: No acute intracranial hemorrhage identified. No midline shift, mass effect, or evidence of intracranial mass lesion. Patchy and confluent cerebral white matter hypodensity. Heterogeneous hypodensity involving the bilateral deep gray matter nuclei. No interval changes are evident. No cortically based acute infarct identified. Vascular: Calcified atherosclerosis at the skull base. No suspicious intracranial vascular hyperdensity. Skull: No acute osseous  abnormality identified. Sinuses/Orbits: Visualized paranasal sinuses and mastoids are stable and well pneumatized. Other: No acute orbit or scalp soft tissue finding. IMPRESSION: Stable non contrast CT appearance of the brain since July with fairly advanced chronic small vessel disease. Electronically Signed   By: Odessa FlemingH  Hall M.D.   On: 12/02/2015 13:58     Management plans discussed with the patient, family and they are in agreement.  CODE STATUS:     Code Status Orders        Start     Ordered   12/03/15 1106  Limited resuscitation (code)  Continuous    Question Answer Comment  In the event of cardiac or respiratory ARREST: Initiate Code Blue, Call Rapid Response Yes   In the event of cardiac or respiratory ARREST: Perform CPR Yes   In the event of cardiac or respiratory ARREST: Perform Intubation/Mechanical Ventilation No   In the event of cardiac or respiratory ARREST: Use NIPPV/BiPAp only if indicated Yes   In the event of cardiac or respiratory ARREST: Administer ACLS medications if indicated Yes   In the event of cardiac or respiratory ARREST: Perform Defibrillation or Cardioversion if indicated Yes      12/03/15 1105    Code Status History    Date Active Date Inactive Code Status Order ID Comments User Context   12/02/2015  4:43 PM 12/03/2015 11:05 AM Full Code [id number]  Katha HammingSnehalatha Konidena, MD ED    Advance Directive Documentation   Flowsheet Row Most Recent Value  Type of Advance Directive  Healthcare Power of Attorney  Pre-existing out of facility DNR order (yellow form or pink MOST form)  No data  "MOST" Form in Place?  No data      TOTAL TIME TAKING CARE OF THIS PATIENT: 33 minutes.    Jhordan Kinter,  Mardi MainlandDavid K M.D on 12/04/2015 at 9:32 AM  Between 7am to 6pm - Pager - [phone]  After 6pm go to [email or web address] - Social research officer, [email or web address] [email or web address]  [facility]  Office  [phone]  CC: Primary care physician; Pcp Not In System

## 2015-12-04 NOTE — Progress Notes (Signed)
EMS arrived to take patient back to the [facility] [address].  Patient alert at transfer.  Daughter notified.

## 2015-12-04 NOTE — Progress Notes (Signed)
Recheck BP improved after norvasc. Pt resting quietly. Agitates readily to touch or stimulus.  Awaiting EMS transport to the [facility].  TC with dgt and make aware of elevated BP, med attempts and emesis that after MD contact plans are to proceed with discharge. She was agreeable to this.

## 2015-12-04 NOTE — Clinical Social Work Note (Signed)
Patient to dc back to [facility] via non-emergent EMS due to AMS. The patient's family and facility are aware. CSW will con't to follow pending any additional dc needs.  Argentina PonderKaren Martha Misbah Hornaday, MSW, LCSW-A [phone]

## 2016-03-11 ENCOUNTER — Emergency Department
Admission: EM | Admit: 2016-03-11 | Discharge: 2016-03-12 | Disposition: A | Discharge status: Home / Self Care | Attending: Emergency Medicine | Admitting: Emergency Medicine

## 2016-03-11 DIAGNOSIS — Z7982 Long term (current) use of aspirin: Secondary | ICD-10-CM | POA: Insufficient documentation

## 2016-03-11 DIAGNOSIS — R1110 Vomiting, unspecified: Secondary | ICD-10-CM | POA: Diagnosis present

## 2016-03-11 DIAGNOSIS — I10 Essential (primary) hypertension: Secondary | ICD-10-CM | POA: Insufficient documentation

## 2016-03-11 DIAGNOSIS — N3000 Acute cystitis without hematuria: Secondary | ICD-10-CM | POA: Diagnosis not present

## 2016-03-11 DIAGNOSIS — Z79899 Other long term (current) drug therapy: Secondary | ICD-10-CM | POA: Diagnosis not present

## 2016-03-12 ENCOUNTER — Encounter: Payer: Self-pay | Admitting: Emergency Medicine

## 2016-03-12 LAB — URINALYSIS, COMPLETE (UACMP) WITH MICROSCOPIC
Bilirubin Urine: NEGATIVE
GLUCOSE, UA: NEGATIVE mg/dL
Ketones, ur: NEGATIVE mg/dL
NITRITE: POSITIVE — AB
Protein, ur: 30 mg/dL — AB
SPECIFIC GRAVITY, URINE: 1.02 (ref 1.005–1.030)
pH: 7.5 (ref 5.0–8.0)

## 2016-03-12 LAB — COMPREHENSIVE METABOLIC PANEL
ALK PHOS: 62 U/L (ref 38–126)
ALT: 17 U/L (ref 14–54)
AST: 25 U/L (ref 15–41)
Albumin: 3.1 g/dL — ABNORMAL LOW (ref 3.5–5.0)
Anion gap: 5 (ref 5–15)
BILIRUBIN TOTAL: 0.7 mg/dL (ref 0.3–1.2)
BUN: 17 mg/dL (ref 6–20)
CALCIUM: 8.6 mg/dL — AB (ref 8.9–10.3)
CHLORIDE: 101 mmol/L (ref 101–111)
CO2: 30 mmol/L (ref 22–32)
CREATININE: 0.75 mg/dL (ref 0.44–1.00)
Glucose, Bld: 105 mg/dL — ABNORMAL HIGH (ref 65–99)
Potassium: 4.8 mmol/L (ref 3.5–5.1)
Sodium: 136 mmol/L (ref 135–145)
TOTAL PROTEIN: 6 g/dL — AB (ref 6.5–8.1)

## 2016-03-12 LAB — CBC
HCT: 41.1 % (ref 35.0–47.0)
HEMOGLOBIN: 14 g/dL (ref 12.0–16.0)
MCH: 30.1 pg (ref 26.0–34.0)
MCHC: 34.1 g/dL (ref 32.0–36.0)
MCV: 88.3 fL (ref 80.0–100.0)
Platelets: 261 10*3/uL (ref 150–440)
RBC: 4.66 MIL/uL (ref 3.80–5.20)
RDW: 15.7 % — ABNORMAL HIGH (ref 11.5–14.5)
WBC: 12 10*3/uL — ABNORMAL HIGH (ref 3.6–11.0)

## 2016-03-12 LAB — LIPASE, BLOOD: LIPASE: 44 U/L (ref 11–51)

## 2016-03-12 MED ORDER — CEFIXIME 400 MG PO TABS: 400.0000 mg | ORAL_TABLET | Freq: Every day | ORAL | 0 refills | Status: AC

## 2016-03-12 MED ORDER — ONDANSETRON HCL 4 MG/2ML IJ SOLN
4.0000 mg | Freq: Once | INTRAMUSCULAR | Status: AC
  Administered 2016-03-12: 4 mg via INTRAVENOUS
  Filled 2016-03-12: qty 2

## 2016-03-12 MED ORDER — DEXTROSE 5 % IV SOLN: 1.0000 g | Freq: Once | INTRAVENOUS | Status: DC

## 2016-03-12 MED ORDER — SODIUM CHLORIDE 0.9 % IV BOLUS (SEPSIS)
500.0000 mL | Freq: Once | INTRAVENOUS | Status: AC
Start: 2016-03-12 — End: 2016-03-12
  Administered 2016-03-12: 500 mL via INTRAVENOUS

## 2016-03-12 MED ORDER — CEFTRIAXONE SODIUM-DEXTROSE 1-3.74 GM-% IV SOLR
1.0000 g | Freq: Once | INTRAVENOUS | Status: AC
  Administered 2016-03-12: 1 g via INTRAVENOUS
  Filled 2016-03-12: qty 50

## 2016-03-12 NOTE — ED Triage Notes (Signed)
Pt. Here via EMS from "[facility]" for vomiting once.

## 2016-03-12 NOTE — ED Provider Notes (Signed)
[facility] Emergency Department Provider Note   ____________________________________________   First MD Initiated Contact with Patient 03/12/16 0003     (approximate)  I have reviewed the triage vital signs and the nursing notes.   HISTORY  Chief Complaint Emesis (Pt. here via EMS from [facility] for vomitting. )  The patient has a history of CVA and is nonverbal.  HPI Cheryl Moreno is a 83 y.o. female who comes into the hospital today after one episode of vomiting. The patient is from the nursing facility called [facility]. The staff tried to give the patient a dose of Zofran but the patient refused. They report that she became combative and tried to bite staff. The patient is on hospice and they did contact the hospice nurse who suggested bringing the patient into the hospital as they are understaffed at her nursing facility. The patient otherwise had no other complaints.   Past Medical History:  Diagnosis Date  . Hypertension   . Hypokalemia   . Osteoporosis   . Stroke [facility])     Patient Active Problem List   Diagnosis Date Noted  . TIA (transient ischemic attack) 12/02/2015  . Pain in limb 11/13/2012  . Dermatophytosis of nail 11/13/2012    Past Surgical History:  Procedure Laterality Date  . BLADDER SUSPENSION    . HIP SURGERY      Prior to Admission medications   Medication Sig Start Date End Date Taking? Authorizing Provider  acetaminophen (TYLENOL) 325 MG tablet Take 650 mg by mouth 3 (three) times daily as needed for mild pain.     Historical Provider, MD  amLODipine (NORVASC) 10 MG tablet Take 10 mg by mouth every morning. Hold for SBP < 100.    Historical Provider, MD  aspirin 81 MG chewable tablet Chew 81 mg by mouth daily.    Historical Provider, MD  cefixime (SUPRAX) 400 MG tablet Take 1 tablet (400 mg total) by mouth daily. 03/12/16   [name], MD  Docusate Sodium (STOOL SOFTENER) 100 MG capsule Take 200 mg by mouth daily as  needed for constipation.    Historical Provider, MD  enalapril (VASOTEC) 20 MG tablet Take 40 mg by mouth every evening.     Historical Provider, MD  ENSURE PLUS (ENSURE PLUS) LIQD Take 1 Can by mouth daily.    Historical Provider, MD  Melatonin 3 MG TABS Take 1 tablet by mouth at bedtime as needed.    Historical Provider, MD  metoprolol (LOPRESSOR) 50 MG tablet Take 75 mg by mouth 2 (two) times daily.     Historical Provider, MD  mirtazapine (REMERON) 7.5 MG tablet Take 1 tablet (7.5 mg total) by mouth at bedtime. 12/04/15   [name], MD  Multiple Vitamins-Minerals (ICAPS) TABS Take 1 tablet by mouth 2 (two) times daily with a meal.    Historical Provider, MD  ondansetron (ZOFRAN) 4 MG tablet Take 4 mg by mouth every 8 (eight) hours as needed for nausea or vomiting.    Historical Provider, MD  sertraline (ZOLOFT) 50 MG tablet Take 50 mg by mouth at bedtime.    Historical Provider, MD  venlafaxine (EFFEXOR) 100 MG tablet Take 100 mg by mouth daily.    Historical Provider, MD    Allergies Sulfa antibiotics  History reviewed. No pertinent family history.  Social History Social History  Substance Use Topics  . Smoking status: Never Smoker  . Smokeless tobacco: Never Used  . Alcohol use No  Review of Systems  Unable to obtain as patient unable to answer questions  ____________________________________________   PHYSICAL EXAM:  VITAL SIGNS: ED Triage Vitals [03/12/16 0008]  Enc Vitals Group     BP (!) 158/70     Pulse Rate 70     Resp 18     Temp 98 F (36.7 C)     Temp Source Oral     SpO2 100 %     Weight 85 lb (38.6 kg)     Height 5\' 2"  (1.575 m)     Head Circumference      Peak Flow      Pain Score      Pain Loc      Pain Edu?      Excl. in GC?     Constitutional: Alert. Well appearing and in no acute distress. Eyes: Conjunctivae are normal. PERRL. EOMI. Head: Atraumatic. Nose: No congestion/rhinnorhea. Mouth/Throat: Mucous membranes are moist.   Oropharynx non-erythematous. Cardiovascular: Normal rate, regular rhythm. Grossly normal heart sounds.  Good peripheral circulation. Respiratory: Normal respiratory effort.  No retractions. Lungs CTAB. Gastrointestinal: Soft and nontender. No distention. Positive bowel sounds Musculoskeletal: No lower extremity tenderness nor edema.  Neurologic:  Garbled speech, does not follow commands Skin:  Skin is warm, dry and intact.  Psychiatric: Mood and affect are normal.   ____________________________________________   LABS (all labs ordered are listed, but only abnormal results are displayed)  Labs Reviewed  CBC - Abnormal; Notable for the following:       Result Value   WBC 12.0 (*)    RDW 15.7 (*)    All other components within normal limits  COMPREHENSIVE METABOLIC PANEL - Abnormal; Notable for the following:    Glucose, Bld 105 (*)    Calcium 8.6 (*)    Total Protein 6.0 (*)    Albumin 3.1 (*)    All other components within normal limits  URINALYSIS, COMPLETE (UACMP) WITH MICROSCOPIC - Abnormal; Notable for the following:    APPearance CLOUDY (*)    Hgb urine dipstick SMALL (*)    Protein, ur 30 (*)    Nitrite POSITIVE (*)    Leukocytes, UA LARGE (*)    Squamous Epithelial / LPF 0-5 (*)    Bacteria, UA MANY (*)    All other components within normal limits  URINE CULTURE  LIPASE, BLOOD   ____________________________________________  EKG  none ____________________________________________  RADIOLOGY  none ____________________________________________   PROCEDURES  Procedure(s) performed: None  Procedures  Critical Care performed: No  ____________________________________________   INITIAL IMPRESSION / ASSESSMENT AND PLAN / ED COURSE  Pertinent labs & imaging results that were available during my care of the patient were reviewed by me and considered in my medical decision making (see chart for details).  This is a 83 year old female who comes into the  hospital today after one episode of vomiting. The patient would not take Zofran and became combative so they sent her here for evaluation. The patient is on hospice. We did check some blood work and it was found that the patient has a white blood cell count of 12 with a dirty-appearing urine. I did give the patient a dose of Zofran as well as a 500 mL bolus of normal saline. The patient will also receive a dose of ceftriaxone given her urinary tract infection. We will ensure that the patient is at her baseline and if that is confirmed that she will be sent back to her nursing home with  oral antibiotics.     We did receive paperwork from hospice stating that the patient had Alzheimer's and was a phasic after her stroke. The patient seems to be at her baseline. She has not had any vomiting here in the emergency department. She will be discharged back to her nursing facility. ____________________________________________   FINAL CLINICAL IMPRESSION(S) / ED DIAGNOSES  Final diagnoses:  Vomiting, intractability of vomiting not specified, presence of nausea not specified, unspecified vomiting type  Acute cystitis without hematuria      NEW MEDICATIONS STARTED DURING THIS VISIT:  New Prescriptions   CEFIXIME (SUPRAX) 400 MG TABLET    Take 1 tablet (400 mg total) by mouth daily.     Note:  This document was prepared using Dragon voice recognition software and may include unintentional dictation errors.    [name], MD 03/12/16 [phone]

## 2016-03-12 NOTE — ED Notes (Signed)
Pt denies needs at this time, pt resting. Call bell at right side.

## 2016-03-12 NOTE — Discharge Instructions (Signed)
Please follow-up with your primary care physician and return with any worsening concerns or complaints.

## 2016-03-14 LAB — URINE CULTURE

## 2016-08-30 DEATH — deceased

## 2018-06-07 IMAGING — CT CT HEAD W/O CM
3 of 6 series · 15 of 47 positions shown, 18 images · non-contrast
Comparison: CT head without contrast 08/01/2015 and earlier.

CLINICAL DATA: [AGE] female left facial droop and weakness
since 8877 hours yesterday. Initial encounter.

EXAM:
CT HEAD WITHOUT CONTRAST
TECHNIQUE: Contiguous axial images were obtained from the base of the skull
through the vertex without intravenous contrast.

[Series 2: head wo · axial · 0.43mm/px · z∈[+198,+343]mm · 10 of 33 slices shown, 13 images]
[im 2/33  brain]
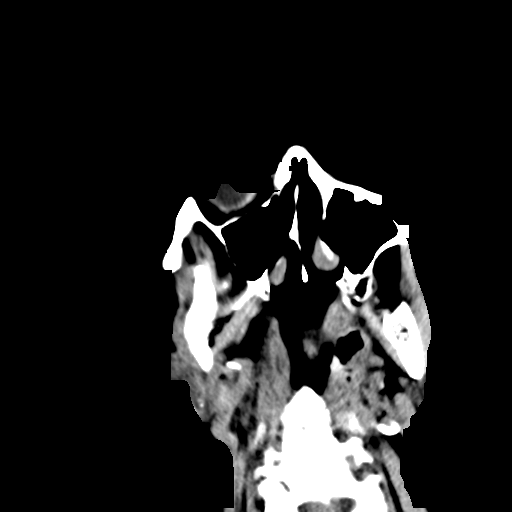
[im 2/33  bone]
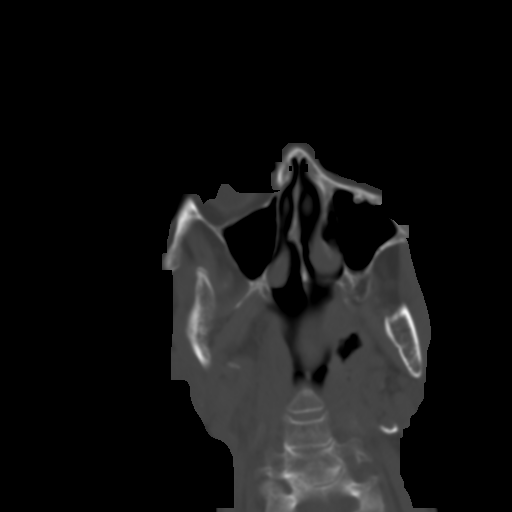
[im 5/33  brain]
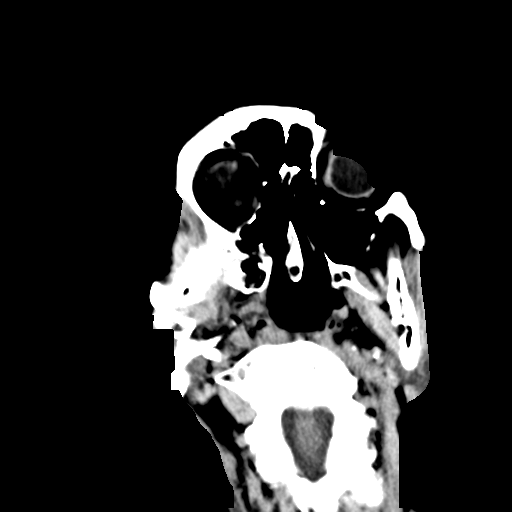
[im 9/33  brain]
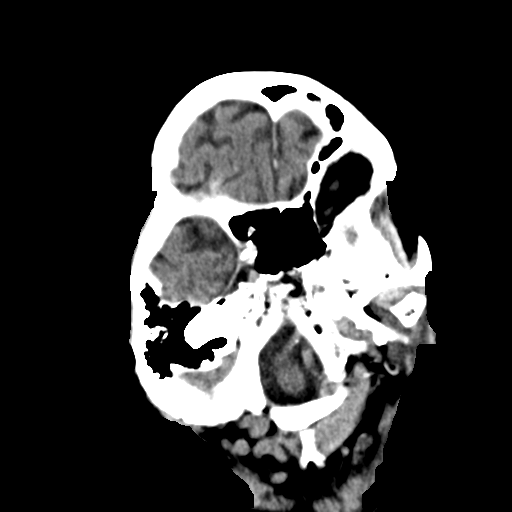
[im 12/33  brain]
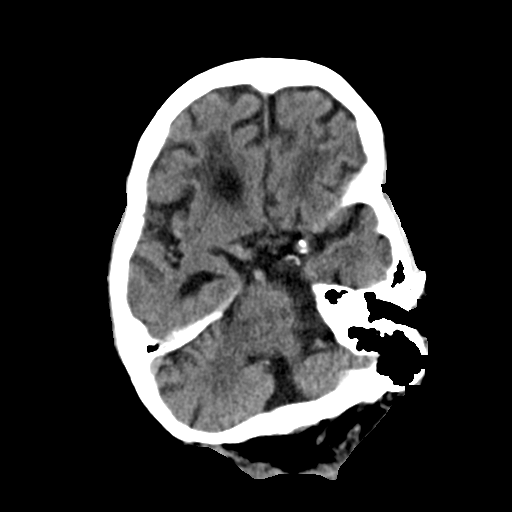
[im 15/33  brain]
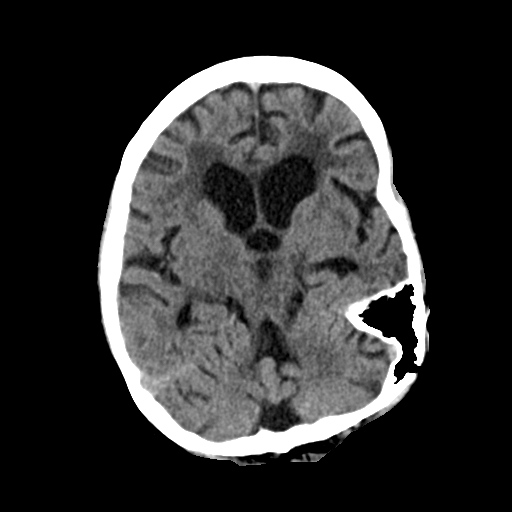
[im 15/33  bone]
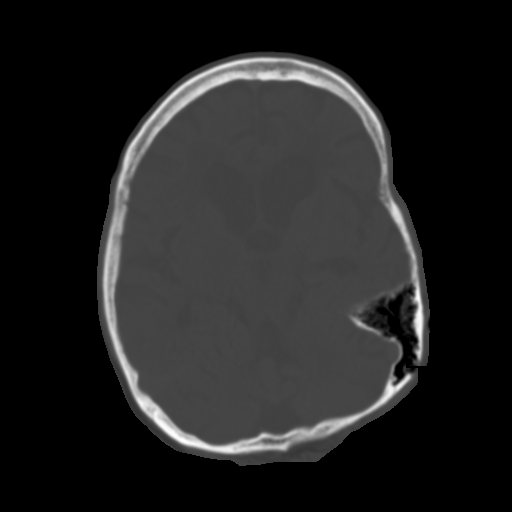
[im 18/33  brain]
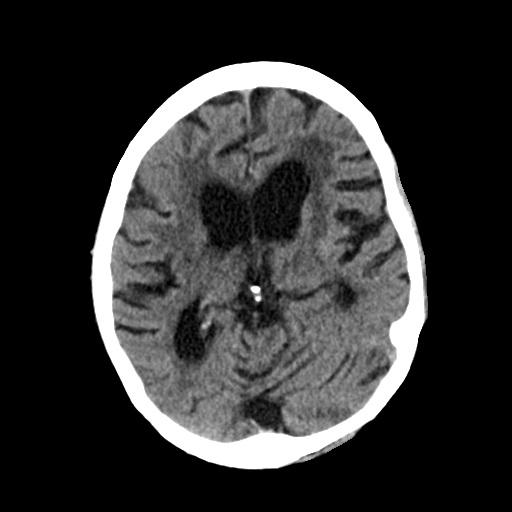
[im 21/33  brain]
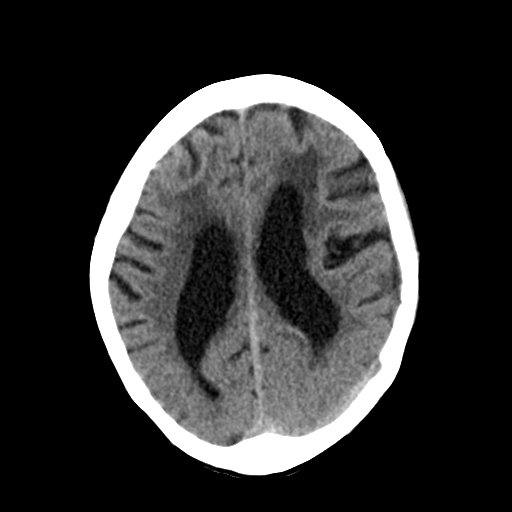
[im 25/33  brain]
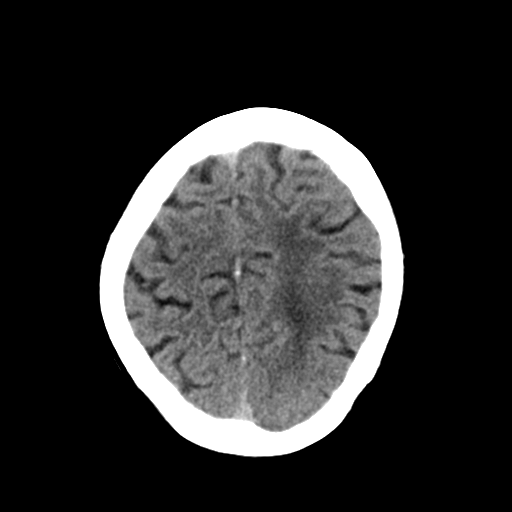
[im 28/33  brain]
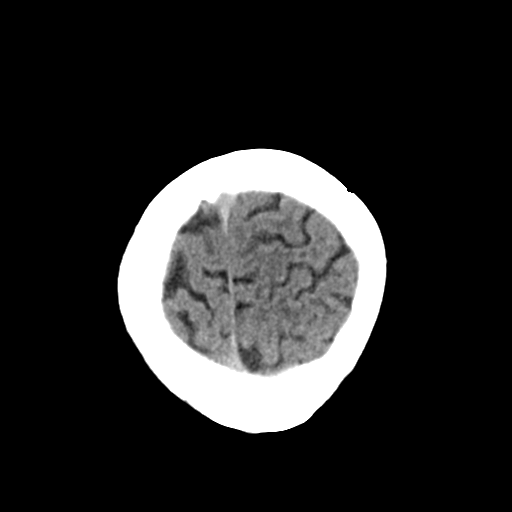
[im 28/33  bone]
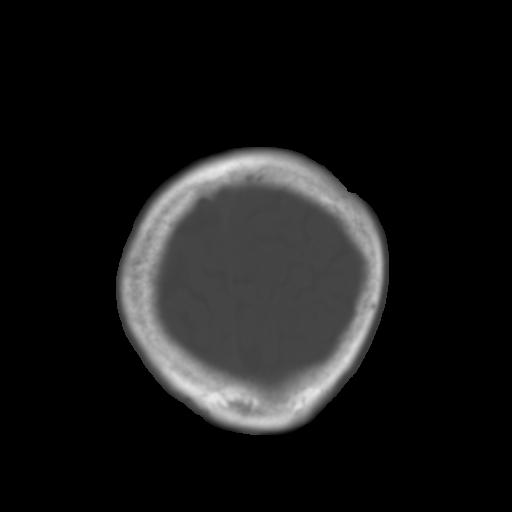
[im 31/33  brain]
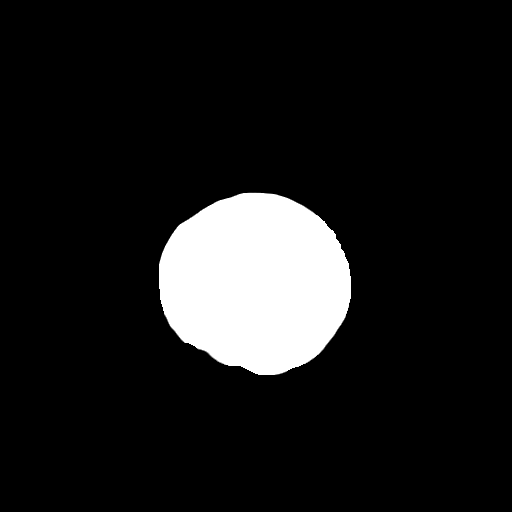

[Series 4: coronal soft tissue · coronal · 0.30mm/px · 3 of 59 slices shown]
[im 15/59  brain]
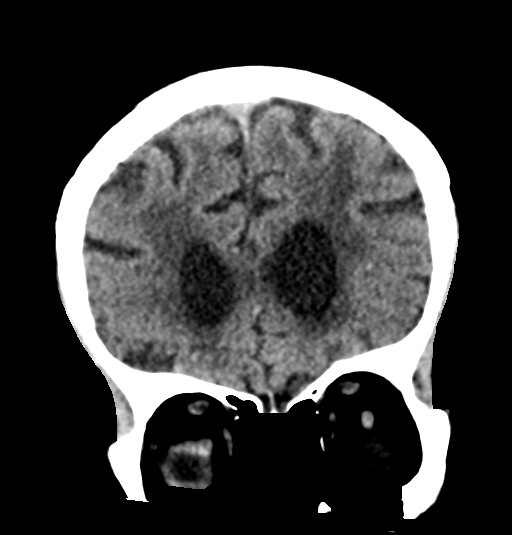
[im 30/59  brain]
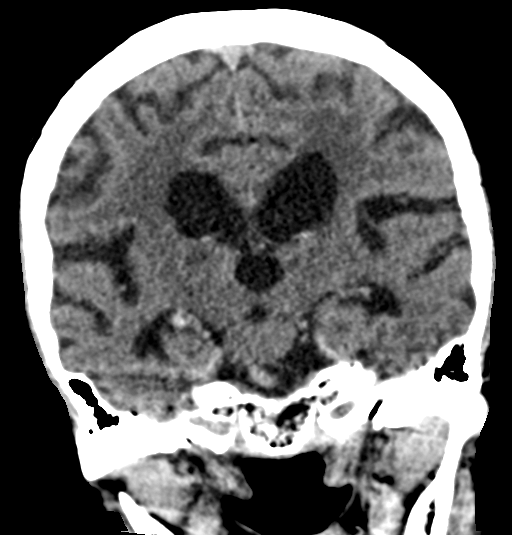
[im 44/59  brain]
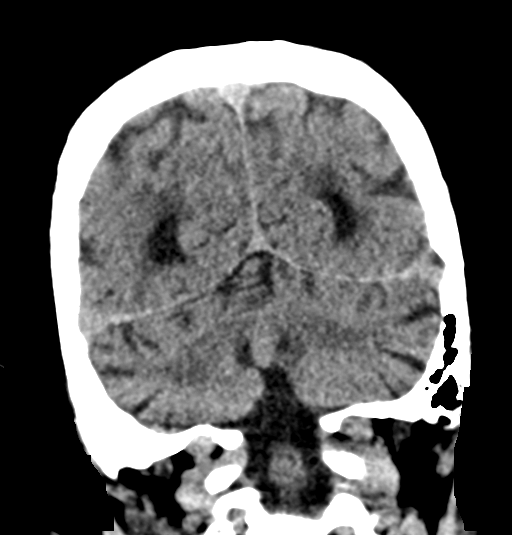

[Series 9: sagittal soft tissue · sagittal · 0.22mm/px · 2 of 55 slices shown]
[im 19/55  brain]
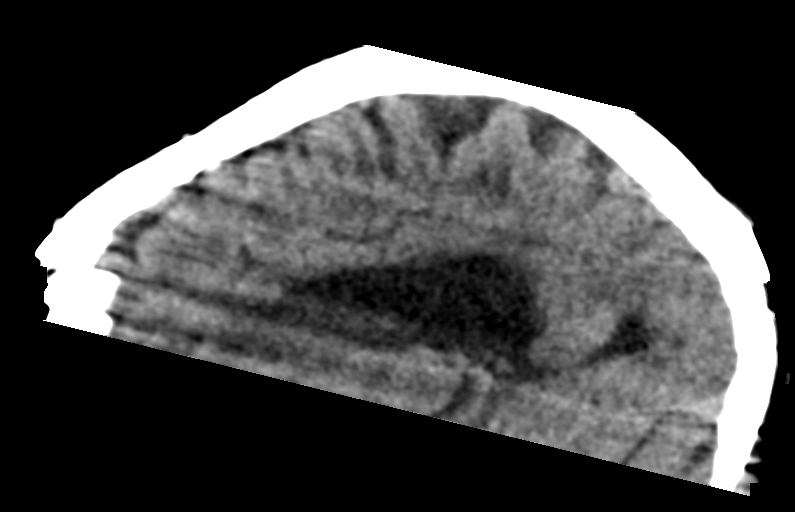
[im 37/55  brain]
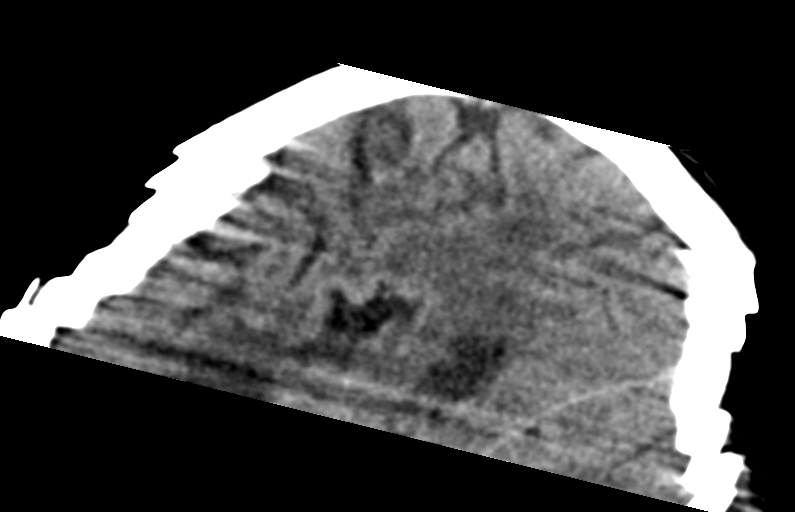

[15 of 47 positions shown; findings below may reference images not displayed]

FINDINGS: Study is intermittently degraded by motion artifact despite repeated
imaging attempts.

Brain: No acute intracranial hemorrhage identified. No midline
shift, mass effect, or evidence of intracranial mass lesion. Patchy
and confluent cerebral white matter hypodensity. Heterogeneous
hypodensity involving the bilateral deep gray matter nuclei. No
interval changes are evident. No cortically based acute infarct
identified.

Vascular: Calcified atherosclerosis at the skull base. No suspicious
intracranial vascular hyperdensity.

Skull: No acute osseous abnormality identified.

Sinuses/Orbits: Visualized paranasal sinuses and mastoids are stable
and well pneumatized.

Other: No acute orbit or scalp soft tissue finding.
IMPRESSION: Stable non contrast CT appearance of the brain since [REDACTED] with
fairly advanced chronic small vessel disease.
